# Patient Record
Sex: Female | Born: 1970 | Race: White | Hispanic: No | Marital: Married | Smoking: Never smoker
Health system: Southern US, Community
[De-identification: ages and names within clinical notes are randomized; demographics above are authoritative.]

## PROBLEM LIST (undated history)

## (undated) DIAGNOSIS — I87323 Chronic venous hypertension (idiopathic) with inflammation of bilateral lower extremity: Secondary | ICD-10-CM

## (undated) DIAGNOSIS — D649 Anemia, unspecified: Secondary | ICD-10-CM

## (undated) DIAGNOSIS — B019 Varicella without complication: Secondary | ICD-10-CM

## (undated) DIAGNOSIS — I872 Venous insufficiency (chronic) (peripheral): Secondary | ICD-10-CM

## (undated) DIAGNOSIS — H16139 Photokeratitis, unspecified eye: Secondary | ICD-10-CM

## (undated) DIAGNOSIS — N920 Excessive and frequent menstruation with regular cycle: Secondary | ICD-10-CM

## (undated) DIAGNOSIS — F53 Postpartum depression: Secondary | ICD-10-CM

## (undated) DIAGNOSIS — T7840XA Allergy, unspecified, initial encounter: Secondary | ICD-10-CM

## (undated) DIAGNOSIS — O99345 Other mental disorders complicating the puerperium: Secondary | ICD-10-CM

## (undated) DIAGNOSIS — F40243 Fear of flying: Secondary | ICD-10-CM

## (undated) DIAGNOSIS — H469 Unspecified optic neuritis: Secondary | ICD-10-CM

## (undated) DIAGNOSIS — G35 Multiple sclerosis: Secondary | ICD-10-CM

## (undated) HISTORY — DX: Anemia, unspecified: D64.9

## (undated) HISTORY — DX: Venous insufficiency (chronic) (peripheral): I87.2

## (undated) HISTORY — DX: Postpartum depression: F53.0

## (undated) HISTORY — DX: Multiple sclerosis: G35

## (undated) HISTORY — DX: Other mental disorders complicating the puerperium: O99.345

## (undated) HISTORY — DX: Excessive and frequent menstruation with regular cycle: N92.0

## (undated) HISTORY — DX: Chronic venous hypertension (idiopathic) with inflammation of bilateral lower extremity: I87.323

## (undated) HISTORY — DX: Unspecified optic neuritis: H46.9

## (undated) HISTORY — DX: Fear of flying: F40.243

## (undated) HISTORY — DX: Varicella without complication: B01.9

## (undated) HISTORY — DX: Photokeratitis, unspecified eye: H16.139

## (undated) HISTORY — DX: Allergy, unspecified, initial encounter: T78.40XA

---

## 2001-01-24 HISTORY — PX: DILATION AND EVACUATION: SHX1459

## 2003-01-25 DIAGNOSIS — G35 Multiple sclerosis: Secondary | ICD-10-CM

## 2003-01-25 HISTORY — DX: Multiple sclerosis: G35

## 2005-01-24 DIAGNOSIS — G35 Multiple sclerosis: Secondary | ICD-10-CM

## 2005-01-24 HISTORY — DX: Multiple sclerosis: G35

## 2012-01-25 HISTORY — PX: LASER ABLATION: SHX1947

## 2016-05-16 HISTORY — PX: OTHER SURGICAL HISTORY: SHX169

## 2016-07-14 LAB — BASIC METABOLIC PANEL
BUN: 15 (ref 4–21)
CREATININE: 0.8 (ref 0.5–1.1)
Glucose: 79
Potassium: 4.4 (ref 3.4–5.3)
Sodium: 138 (ref 137–147)

## 2016-07-14 LAB — LIPID PANEL
CHOLESTEROL: 165 (ref 0–200)
HDL: 72 — AB (ref 35–70)
LDL Cholesterol: 55
Triglycerides: 188 — AB (ref 40–160)

## 2016-07-14 LAB — HEPATIC FUNCTION PANEL
ALT: 23 (ref 7–35)
AST: 17 (ref 13–35)
Alkaline Phosphatase: 59 (ref 25–125)
Bilirubin, Total: 0.3

## 2016-07-14 LAB — CBC AND DIFFERENTIAL
HCT: 39 (ref 36–46)
Hemoglobin: 13.8 (ref 12.0–16.0)
NEUTROS ABS: 3
Platelets: 254 (ref 150–399)
WBC: 6.5

## 2016-07-14 LAB — VITAMIN D 25 HYDROXY (VIT D DEFICIENCY, FRACTURES): VIT D 25 HYDROXY: 58.7

## 2016-07-14 LAB — TSH: TSH: 1.05 (ref 0.41–5.90)

## 2016-12-07 DIAGNOSIS — H16139 Photokeratitis, unspecified eye: Secondary | ICD-10-CM

## 2016-12-07 HISTORY — PX: OTHER SURGICAL HISTORY: SHX169

## 2016-12-07 HISTORY — DX: Photokeratitis, unspecified eye: H16.139

## 2016-12-19 LAB — HM MAMMOGRAPHY

## 2017-07-10 HISTORY — PX: OTHER SURGICAL HISTORY: SHX169

## 2017-11-21 ENCOUNTER — Ambulatory Visit (INDEPENDENT_AMBULATORY_CARE_PROVIDER_SITE_OTHER): Payer: Managed Care, Other (non HMO) | Admitting: Family Medicine

## 2017-11-21 ENCOUNTER — Encounter: Payer: Self-pay | Admitting: Family Medicine

## 2017-11-21 VITALS — BP 106/74 | HR 80 | Temp 98.3°F | Resp 20 | Ht 64.0 in | Wt 157.0 lb

## 2017-11-21 DIAGNOSIS — K580 Irritable bowel syndrome with diarrhea: Secondary | ICD-10-CM

## 2017-11-21 DIAGNOSIS — Z7689 Persons encountering health services in other specified circumstances: Secondary | ICD-10-CM

## 2017-11-21 DIAGNOSIS — Z3041 Encounter for surveillance of contraceptive pills: Secondary | ICD-10-CM

## 2017-11-21 DIAGNOSIS — G35D Multiple sclerosis, unspecified: Secondary | ICD-10-CM

## 2017-11-21 DIAGNOSIS — L570 Actinic keratosis: Secondary | ICD-10-CM | POA: Diagnosis not present

## 2017-11-21 DIAGNOSIS — G35 Multiple sclerosis: Secondary | ICD-10-CM | POA: Diagnosis not present

## 2017-11-21 MED ORDER — NORGESTIMATE-ETH ESTRADIOL 0.25-35 MG-MCG PO TABS: 1.0000 | ORAL_TABLET | Freq: Every day | ORAL | 2 refills | Status: DC

## 2017-11-21 MED ORDER — HYOSCYAMINE SULFATE 0.125 MG SL SUBL: 0.1250 mg | SUBLINGUAL_TABLET | SUBLINGUAL | 1 refills | Status: DC | PRN

## 2017-11-21 NOTE — Progress Notes (Signed)
Patient ID: Taylor Clements, female  DOB: 17-Jan-1971, 47 y.o.   MRN: [id number] Patient Care Team    Relationship Specialty Notifications Start End  Natalia Leatherwood, DO PCP - General Family Medicine  11/21/17     Chief Complaint  Patient presents with  . Establish Care    Subjective:  Taylor Clements is a 47 y.o.  female present for new patient establishment. All past medical history, surgical history, allergies, family history, immunizations, medications and social history were updated in the electronic medical record today. All recent labs, ED visits and hospitalizations within the last year were reviewed.  Moved from [place].  Multiple sclerosis (HCC) Diagnosed in 2005 after optic neuritis. She reports when she gets flares she has IBS-D like symptoms, fatigue, right arm paresthesia. She has been managed on peg-rinterferon (Plegridy). She Would like referral to Dr. Leilani Merl, neuro.   Actinic keratosis Has been followed by dermatology for AK monitoring. No h/o skin cancer. Would like referral to derm.   Irritable bowel syndrome with diarrhea Mostly with MS flares, but does have some episodes when she does not eat right as well. Takes levsin when needed.   Birth control pill maintenance: Last PAP 2016. Hs been on sprintec for menorrhagia. Due for refills.   Depression screen PHQ 2/9 11/21/2017  Decreased Interest 0  Down, Depressed, Hopeless 0  PHQ - 2 Score 0   No flowsheet data found.  Current Exercise Habits: The patient does not participate in regular exercise at present Exercise limited by: None identified No flowsheet data found.   There is no immunization history on file for this patient.  No exam data present  Past Medical History:  Diagnosis Date  . Actinic keratitis 12/07/2016   shaved, deep margins- pt declined further biospy, aldara and  Excel V laser completed by derm  . Allergy   . Chicken pox   . Idiopathic chronic venous hypertension of both legs  with inflammation   . Menorrhagia    control on BCP  . Multiple sclerosis (HCC) 2005   diagnosed 2005; after optic Neuritis. Has IBS, fatiue and right arm paresthesia w/ flares  . Optic neuritis due to multiple sclerosis (HCC) 2007   loss vision; IV steroids --> vision returned.   . Phobia, flying    uses ativan with flights  . Post partum depression   . Venous insufficiency (chronic) (peripheral)    No Known Allergies Past Surgical History:  Procedure Laterality Date  . DILATION AND EVACUATION  2003   nonviable pregnancy  . image: duplex LLE Left 05/16/2016   scanned into media.   . Image: MRI Brain  07/10/2017   Stable minor subcortical & periventricular white matter changes; MS  . LASER ABLATION Left 2014   left greater saphenous vein, then endoevous chemical ablation and sclerotherpay.   . procedure: Excel V laser  12/07/2016   Ak tip of nose shaved-deep margins-pt declined deeper bx, aldara cream and laser completed by derm.    Family History  Problem Relation Age of Onset  . Breast cancer Mother   . Asthma Brother   . Depression Brother   . Drug abuse Brother   . Learning disabilities Brother   . Stroke Maternal Grandmother    Social History   Socioeconomic History  . Marital status: Married    Spouse name: Not on file  . Number of children: Not on file  . Years of education: Not on file  . Highest education level: Not  on file  Occupational History  . Not on file  Social Needs  . Financial resource strain: Not on file  . Food insecurity:    Worry: Not on file    Inability: Not on file  . Transportation needs:    Medical: Not on file    Non-medical: Not on file  Tobacco Use  . Smoking status: Former Games developer  . Smokeless tobacco: Never Used  Substance and Sexual Activity  . Alcohol use: Not Currently  . Drug use: Never  . Sexual activity: Yes    Partners: Male  Lifestyle  . Physical activity:    Days per week: Not on file    Minutes per session: Not  on file  . Stress: Not on file  Relationships  . Social connections:    Talks on phone: Not on file    Gets together: Not on file    Attends religious service: Not on file    Active member of club or organization: Not on file    Attends meetings of clubs or organizations: Not on file    Relationship status: Not on file  . Intimate partner violence:    Fear of current or ex partner: Not on file    Emotionally abused: Not on file    Physically abused: Not on file    Forced sexual activity: Not on file  Other Topics Concern  . Not on file  Social History Narrative   Marital status/children/pets: married, 1 child   Safety:      -smoke alarm in the home:Yes     - wears seatbelt: Yes     - Feels safe in their relationships: Yes   Allergies as of 11/21/2017   No Known Allergies     Medication List        Accurate as of 11/21/17 11:59 PM. Always use your most recent med list.          Calcium 500-125 MG-UNIT Tabs Take 2 tablets by mouth daily.   Fish Oil 1200 MG Caps Take 1 capsule by mouth 2 (two) times daily.   hyoscyamine 0.125 MG SL tablet Commonly known as:  LEVSIN SL Place 1 tablet (0.125 mg total) under the tongue every 4 (four) hours as needed.   Loratadine 10 MG Caps Take 1 capsule by mouth daily.   LORazepam 0.5 MG tablet Commonly known as:  ATIVAN Take 0.5 mg by mouth every 8 (eight) hours. 1-2 tabs prior to flight for anxiety   multivitamin capsule Take 1 capsule by mouth daily.   norgestimate-ethinyl estradiol 0.25-35 MG-MCG tablet Commonly known as:  ORTHO-CYCLEN,SPRINTEC,PREVIFEM Take 1 tablet by mouth daily.   Olopatadine HCl 0.2 % Soln Apply 1 drop to eye daily.   PLEGRIDY 125 MCG/0.5ML Sopn Generic drug:  Peginterferon Beta-1a Inject 125 mcg into the skin every 14 (fourteen) days.       All past medical history, surgical history, allergies, family history, immunizations andmedications were updated in the EMR today and reviewed under the  history and medication portions of their EMR.    No results found for this or any previous visit (from the past 2160 hour(s)).  Patient was never admitted.   ROS: 14 pt review of systems performed and negative (unless mentioned in an HPI)  Objective: BP 106/74 (BP Location: Right Arm, Patient Position: Sitting, Cuff Size: Large)   Pulse 80   Temp 98.3 F (36.8 C)   Resp 20   Ht 5\' 4"  (1.626 m)   Wt  157 lb (71.2 kg)   LMP 11/08/2017   SpO2 100%   BMI 26.95 kg/m  Gen: Afebrile. No acute distress. Nontoxic in appearance, well-developed, well-nourished, pleasant Caucasian female. HENT: AT. [place]. MMM, no oral lesions, adequate dentition.  Eyes:Pupils Equal Round Reactive to light, Extraocular movements intact,  Conjunctiva without redness, discharge or icterus. Neck/lymp/endocrine: Supple, no lymphadenopathy, no thyromegaly CV: RRR no murmur Chest: CTAB, no wheeze, rhonchi or crackles.  Abd: Soft. NTND. BS present. Skin: Warm and well-perfused. Skin intact. Neuro/Msk:  Normal gait. PERLA. EOMi. Alert. Oriented x3.   Psych: Normal affect, dress and demeanor. Normal speech. Normal thought content and judgment.   Assessment/plan: Anjelica Berres is a 47 y.o. female present for est care.  Multiple sclerosis (HCC) Was able to review some of patient's records she brought with her today after her establishment visit.  She had requested referral to Dr. Cena Benton and neurology and this was placed today.  We will upload her most current neurology records into the system and fax to neurology with the referral today.  This of course was done with her permission. - Ambulatory referral to Neurology  Actinic keratosis She had been monitored for actinic keratosis by dermatology.  One particular location at the tip of her nose has undergone shave biopsy, Aldara and laser treatment. - Ambulatory referral to Dermatology  Irritable bowel syndrome with diarrhea Is commonly triggered by an MS flare and/or  poor eating habits.  It is stable on Levsin 0.25 mg every 4 hours as needed.  Refills provided to her today.  Encounter for birth control pills maintenance Originally placed on birth control pills for menorrhagia.  She has been doing well on Sprintec.  Last Pap was reported in 2016.  Agreed to refill patient's birth control pills for 3 months.  In the meantime she will schedule a CPE with Pap and further refills will be completed after her Pap.  She is agreeable to this today.  Return in about 3 months (around 02/21/2018) for CPE with Pap.   Note is dictated utilizing voice recognition software. Although note has been proof read prior to signing, occasional typographical errors still can be missed. If any questions arise, please do not hesitate to call for verification.  Electronically signed by: Felix Pacini, DO [facility] [place]

## 2017-11-21 NOTE — Patient Instructions (Addendum)
Very nice to meet you today.  Please schedule your Physical and PAP with in next 2 months. I have refilled the birth control pills and levsin.   Referred you to neurology and dermatology, they will call you with results.   Please help [place] help you:  We are honored you have chosen [facility] for your Primary Care home. Below you will find basic instructions that you may need to access in the future. Please help [place] help you by reading the instructions, which cover many of the frequent questions we experience.   Prescription refills and request:  -In order to allow more efficient response time, please call your pharmacy for all refills. They will forward the request electronically to [place]. This allows for the quickest possible response. Request left on a nurse line can take longer to refill, since these are checked as time allows between office patients and other phone calls.  - refill request can take up to 3-5 working days to complete.  - If request is sent electronically and request is appropiate, it is usually completed in 1-2 business days.  - all patients will need to be seen routinely for all chronic medical conditions requiring prescription medications (see follow-up below). If you are overdue for follow up on your condition, you will be asked to make an appointment and we will call in enough medication to cover you until your appointment (up to 30 days).  - all controlled substances will require a face to face visit to request/refill.  - if you desire your prescriptions to go through a new pharmacy, and have an active script at original pharmacy, you will need to call your pharmacy and have scripts transferred to new pharmacy. This is completed between the pharmacy locations and not by your provider.    Results: If any images or labs were ordered, it can take up to 1 week to get results depending on the test ordered and the lab/facility running and resulting the test. - Normal or stable  results, which do not need further discussion, may be released to your mychart immediately with attached note to you. A call may not be generated for normal results. Please make certain to sign up for mychart. If you have questions on how to activate your mychart you can call the front office.  - If your results need further discussion, our office will attempt to contact you via phone, and if unable to reach you after 2 attempts, we will release your abnormal result to your mychart with instructions.  - All results will be automatically released in mychart after 1 week.  - Your provider will provide you with explanation and instruction on all relevant material in your results. Please keep in mind, results and labs may appear confusing or abnormal to the untrained eye, but it does not mean they are actually abnormal for you personally. If you have any questions about your results that are not covered, or you desire more detailed explanation than what was provided, you should make an appointment with your provider to do so.   Our office handles many outgoing and incoming calls daily. If we have not contacted you within 1 week about your results, please check your mychart to see if there is a message first and if not, then contact our office.  In helping with this matter, you help decrease call volume, and therefore allow [place] to be able to respond to patients needs more efficiently.   Acute office visits (sick  visit):  An acute visit is intended for a new problem and are scheduled in shorter time slots to allow schedule openings for patients with new problems. This is the appropriate visit to discuss a new problem. Problems will not be addressed by phone call or Echart message. Appointment is needed if requesting treatment. In order to provide you with excellent quality medical care with proper time for you to explain your problem, have an exam and receive treatment with instructions, these appointments should  be limited to one new problem per visit. If you experience a new problem, in which you desire to be addressed, please make an acute office visit, we save openings on the schedule to accommodate you. Please do not save your new problem for any other type of visit, let us take care of it properly and quickly for you.   Follow up visits:  Depending on your condition(s) your provider will need to see you routinely in order to provide you with quality care and prescribe medication(s). Most chronic conditions (Example: hypertension, Diabetes, depression/anxiety... etc), require visits a couple times a year. Your provider will instruct you on proper follow up for your personal medical conditions and history. Please make certain to make follow up appointments for your condition as instructed. Failing to do so could result in lapse in your medication treatment/refills. If you request a refill, and are overdue to be seen on a condition, we will always provide you with a 30 day script (once) to allow you time to schedule.    Medicare wellness (well visit): - we have a wonderful Nurse Maudie Mercury), that will meet with you and provide you will yearly medicare wellness visits. These visits should occur yearly (can not be scheduled less than 1 calendar year apart) and cover preventive health, immunizations, advance directives and screenings you are entitled to yearly through your medicare benefits. Do not miss out on your entitled benefits, this is when medicare will pay for these benefits to be ordered for you.  These are strongly encouraged by your provider and is the appropriate type of visit to make certain you are up to date with all preventive health benefits. If you have not had your medicare wellness exam in the last 12 months, please make certain to schedule one by calling the office and schedule your medicare wellness with Maudie Mercury as soon as possible.   Yearly physical (well visit):  - Adults are recommended to be seen  yearly for physicals. Check with your insurance and date of your last physical, most insurances require one calendar year between physicals. Physicals include all preventive health topics, screenings, medical exam and labs that are appropriate for gender/age and history. You may have fasting labs needed at this visit. This is a well visit (not a sick visit), new problems should not be covered during this visit (see acute visit).  - Pediatric patients are seen more frequently when they are younger. Your provider will advise you on well child visit timing that is appropriate for your their age. - This is not a medicare wellness visit. Medicare wellness exams do not have an exam portion to the visit. Some medicare companies allow for a physical, some do not allow a yearly physical. If your medicare allows a yearly physical you can schedule the medicare wellness with our nurse Maudie Mercury and have your physical with your provider after, on the same day. Please check with insurance for your full benefits.   Late Policy/No Shows:  - all new patients  should arrive 15-30 minutes earlier than appointment to allow [place] time  to  obtain all personal demographics,  insurance information and for you to complete office paperwork. - All established patients should arrive 10-15 minutes earlier than appointment time to update all information and be checked in .  - In our best efforts to run on time, if you are late for your appointment you will be asked to either reschedule or if able, we will work you back into the schedule. There will be a wait time to work you back in the schedule,  depending on availability.  - If you are unable to make it to your appointment as scheduled, please call 24 hours ahead of time to allow [place] to fill the time slot with someone else who needs to be seen. If you do not cancel your appointment ahead of time, you may be charged a no show fee.

## 2017-11-22 ENCOUNTER — Encounter: Payer: Self-pay | Admitting: Family Medicine

## 2017-11-22 DIAGNOSIS — Z3041 Encounter for surveillance of contraceptive pills: Secondary | ICD-10-CM | POA: Insufficient documentation

## 2017-11-22 DIAGNOSIS — K580 Irritable bowel syndrome with diarrhea: Secondary | ICD-10-CM | POA: Insufficient documentation

## 2017-11-22 DIAGNOSIS — L570 Actinic keratosis: Secondary | ICD-10-CM | POA: Insufficient documentation

## 2017-11-23 ENCOUNTER — Encounter: Payer: Self-pay | Admitting: Family Medicine

## 2017-12-27 ENCOUNTER — Other Ambulatory Visit (HOSPITAL_COMMUNITY)
Admission: RE | Admit: 2017-12-27 | Discharge: 2017-12-27 | Disposition: A | Discharge status: Home / Self Care | Payer: Managed Care, Other (non HMO) | Source: Ambulatory Visit | Attending: Family Medicine | Admitting: Family Medicine

## 2017-12-27 ENCOUNTER — Encounter: Payer: Self-pay | Admitting: Family Medicine

## 2017-12-27 ENCOUNTER — Ambulatory Visit (INDEPENDENT_AMBULATORY_CARE_PROVIDER_SITE_OTHER): Payer: Managed Care, Other (non HMO) | Admitting: Family Medicine

## 2017-12-27 VITALS — BP 115/77 | HR 88 | Temp 98.0°F | Resp 16 | Ht 64.5 in | Wt 159.0 lb

## 2017-12-27 DIAGNOSIS — Z01419 Encounter for gynecological examination (general) (routine) without abnormal findings: Secondary | ICD-10-CM

## 2017-12-27 DIAGNOSIS — Z1239 Encounter for other screening for malignant neoplasm of breast: Secondary | ICD-10-CM

## 2017-12-27 DIAGNOSIS — Z0000 Encounter for general adult medical examination without abnormal findings: Secondary | ICD-10-CM | POA: Diagnosis not present

## 2017-12-27 DIAGNOSIS — Z114 Encounter for screening for human immunodeficiency virus [HIV]: Secondary | ICD-10-CM

## 2017-12-27 DIAGNOSIS — E781 Pure hyperglyceridemia: Secondary | ICD-10-CM | POA: Diagnosis not present

## 2017-12-27 DIAGNOSIS — Z130 Encounter for screening for diseases of the blood and blood-forming organs and certain disorders involving the immune mechanism: Secondary | ICD-10-CM

## 2017-12-27 DIAGNOSIS — Z23 Encounter for immunization: Secondary | ICD-10-CM | POA: Diagnosis not present

## 2017-12-27 DIAGNOSIS — Z131 Encounter for screening for diabetes mellitus: Secondary | ICD-10-CM | POA: Diagnosis not present

## 2017-12-27 DIAGNOSIS — K580 Irritable bowel syndrome with diarrhea: Secondary | ICD-10-CM

## 2017-12-27 DIAGNOSIS — G35 Multiple sclerosis: Secondary | ICD-10-CM

## 2017-12-27 DIAGNOSIS — Z3041 Encounter for surveillance of contraceptive pills: Secondary | ICD-10-CM

## 2017-12-27 LAB — LIPID PANEL
Cholesterol: 182 mg/dL (ref 0–200)
HDL: 74.3 mg/dL (ref >39.00)
LDL Cholesterol: 76 mg/dL (ref 0–99)
NonHDL: 107.49
Total CHOL/HDL Ratio: 2
Triglycerides: 157 mg/dL — ABNORMAL HIGH (ref 0.0–149.0)
VLDL: 31.4 mg/dL (ref 0.0–40.0)

## 2017-12-27 LAB — HEMOGLOBIN A1C: Hgb A1c MFr Bld: 5.6 % (ref 4.6–6.5)

## 2017-12-27 LAB — COMPREHENSIVE METABOLIC PANEL
ALBUMIN: 4.3 g/dL (ref 3.5–5.2)
ALT: 14 U/L (ref 0–35)
AST: 13 U/L (ref 0–37)
Alkaline Phosphatase: 53 U/L (ref 39–117)
BUN: 16 mg/dL (ref 6–23)
CO2: 24 mEq/L (ref 19–32)
Calcium: 9.4 mg/dL (ref 8.4–10.5)
Chloride: 105 mEq/L (ref 96–112)
Creatinine, Ser: 0.68 mg/dL (ref 0.40–1.20)
GFR: 98.35 mL/min (ref >60.00)
Glucose, Bld: 85 mg/dL (ref 70–99)
Potassium: 4.3 mEq/L (ref 3.5–5.1)
Sodium: 138 mEq/L (ref 135–145)
Total Bilirubin: 0.4 mg/dL (ref 0.2–1.2)
Total Protein: 7.4 g/dL (ref 6.0–8.3)

## 2017-12-27 LAB — CBC
HCT: 39.9 % (ref 36.0–46.0)
Hemoglobin: 13.6 g/dL (ref 12.0–15.0)
MCHC: 34 g/dL (ref 30.0–36.0)
MCV: 89.6 fl (ref 78.0–100.0)
Platelets: 315 10*3/uL (ref 150.0–400.0)
RBC: 4.46 Mil/uL (ref 3.87–5.11)
RDW: 12.9 % (ref 11.5–15.5)
WBC: 5.6 10*3/uL (ref 4.0–10.5)

## 2017-12-27 LAB — TSH: TSH: 1.02 u[IU]/mL (ref 0.35–4.50)

## 2017-12-27 LAB — HM PAP SMEAR: HM Pap smear: NEGATIVE

## 2017-12-27 MED ORDER — NORGESTIMATE-ETH ESTRADIOL 0.25-35 MG-MCG PO TABS: 1.0000 | ORAL_TABLET | Freq: Every day | ORAL | 11 refills | Status: DC

## 2017-12-27 MED ORDER — HYOSCYAMINE SULFATE 0.125 MG SL SUBL: 0.1250 mg | SUBLINGUAL_TABLET | SUBLINGUAL | 1 refills | Status: DC | PRN

## 2017-12-27 NOTE — Patient Instructions (Addendum)
We will call you with all your lab results once resulted.  They will call to schedule your mammogram at the breast center.  Health Maintenance, Female Adopting a healthy lifestyle and getting preventive care can go a long way to promote health and wellness. Talk with your health care provider about what schedule of regular examinations is right for you. This is a good chance for you to check in with your provider about disease prevention and staying healthy. In between checkups, there are plenty of things you can do on your own. Experts have done a lot of research about which lifestyle changes and preventive measures are most likely to keep you healthy. Ask your health care provider for more information. Weight and diet Eat a healthy diet  Be sure to include plenty of vegetables, fruits, low-fat dairy products, and lean protein.  Do not eat a lot of foods high in solid fats, added sugars, or salt.  Get regular exercise. This is one of the most important things you can do for your health. ? Most adults should exercise for at least 150 minutes each week. The exercise should increase your heart rate and make you sweat (moderate-intensity exercise). ? Most adults should also do strengthening exercises at least twice a week. This is in addition to the moderate-intensity exercise.  Maintain a healthy weight  Body mass index (BMI) is a measurement that can be used to identify possible weight problems. It estimates body fat based on height and weight. Your health care provider can help determine your BMI and help you achieve or maintain a healthy weight.  For females 52 years of age and older: ? A BMI below 18.5 is considered underweight. ? A BMI of 18.5 to 24.9 is normal. ? A BMI of 25 to 29.9 is considered overweight. ? A BMI of 30 and above is considered obese.  Watch levels of cholesterol and blood lipids  You should start having your blood tested for lipids and cholesterol at 47 years of  age, then have this test every 5 years.  You may need to have your cholesterol levels checked more often if: ? Your lipid or cholesterol levels are high. ? You are older than 47 years of age. ? You are at high risk for heart disease.  Cancer screening Lung Cancer  Lung cancer screening is recommended for adults 64-29 years old who are at high risk for lung cancer because of a history of smoking.  A yearly low-dose CT scan of the lungs is recommended for people who: ? Currently smoke. ? Have quit within the past 15 years. ? Have at least a 30-pack-year history of smoking. A pack year is smoking an average of one pack of cigarettes a day for 1 year.  Yearly screening should continue until it has been 15 years since you quit.  Yearly screening should stop if you develop a health problem that would prevent you from having lung cancer treatment.  Breast Cancer  Practice breast self-awareness. This means understanding how your breasts normally appear and feel.  It also means doing regular breast self-exams. Let your health care provider know about any changes, no matter how small.  If you are in your 20s or 30s, you should have a clinical breast exam (CBE) by a health care provider every 1-3 years as part of a regular health exam.  If you are 55 or older, have a CBE every year. Also consider having a breast X-ray (mammogram) every year.  If  you have a family history of breast cancer, talk to your health care provider about genetic screening.  If you are at high risk for breast cancer, talk to your health care provider about having an MRI and a mammogram every year.  Breast cancer gene (BRCA) assessment is recommended for women who have family members with BRCA-related cancers. BRCA-related cancers include: ? Breast. ? Ovarian. ? Tubal. ? Peritoneal cancers.  Results of the assessment will determine the need for genetic counseling and BRCA1 and BRCA2 testing.  Cervical  Cancer Your health care provider may recommend that you be screened regularly for cancer of the pelvic organs (ovaries, uterus, and vagina). This screening involves a pelvic examination, including checking for microscopic changes to the surface of your cervix (Pap test). You may be encouraged to have this screening done every 3 years, beginning at age 68.  For women ages 86-65, health care providers may recommend pelvic exams and Pap testing every 3 years, or they may recommend the Pap and pelvic exam, combined with testing for human papilloma virus (HPV), every 5 years. Some types of HPV increase your risk of cervical cancer. Testing for HPV may also be done on women of any age with unclear Pap test results.  Other health care providers may not recommend any screening for nonpregnant women who are considered low risk for pelvic cancer and who do not have symptoms. Ask your health care provider if a screening pelvic exam is right for you.  If you have had past treatment for cervical cancer or a condition that could lead to cancer, you need Pap tests and screening for cancer for at least 20 years after your treatment. If Pap tests have been discontinued, your risk factors (such as having a new sexual partner) need to be reassessed to determine if screening should resume. Some women have medical problems that increase the chance of getting cervical cancer. In these cases, your health care provider may recommend more frequent screening and Pap tests.  Colorectal Cancer  This type of cancer can be detected and often prevented.  Routine colorectal cancer screening usually begins at 47 years of age and continues through 47 years of age.  Your health care provider may recommend screening at an earlier age if you have risk factors for colon cancer.  Your health care provider may also recommend using home test kits to check for hidden blood in the stool.  A small camera at the end of a tube can be used to  examine your colon directly (sigmoidoscopy or colonoscopy). This is done to check for the earliest forms of colorectal cancer.  Routine screening usually begins at age 17.  Direct examination of the colon should be repeated every 5-10 years through 47 years of age. However, you may need to be screened more often if early forms of precancerous polyps or small growths are found.  Skin Cancer  Check your skin from head to toe regularly.  Tell your health care provider about any new moles or changes in moles, especially if there is a change in a mole's shape or color.  Also tell your health care provider if you have a mole that is larger than the size of a pencil eraser.  Always use sunscreen. Apply sunscreen liberally and repeatedly throughout the day.  Protect yourself by wearing long sleeves, pants, a wide-brimmed hat, and sunglasses whenever you are outside.  Heart disease, diabetes, and high blood pressure  High blood pressure causes heart disease and increases  the risk of stroke. High blood pressure is more likely to develop in: ? People who have blood pressure in the high end of the normal range (130-139/85-89 mm Hg). ? People who are overweight or obese. ? People who are African American.  If you are 41-17 years of age, have your blood pressure checked every 3-5 years. If you are 40 years of age or older, have your blood pressure checked every year. You should have your blood pressure measured twice-once when you are at a hospital or clinic, and once when you are not at a hospital or clinic. Record the average of the two measurements. To check your blood pressure when you are not at a hospital or clinic, you can use: ? An automated blood pressure machine at a pharmacy. ? A home blood pressure monitor.  If you are between 10 years and 68 years old, ask your health care provider if you should take aspirin to prevent strokes.  Have regular diabetes screenings. This involves taking a  blood sample to check your fasting blood sugar level. ? If you are at a normal weight and have a low risk for diabetes, have this test once every three years after 47 years of age. ? If you are overweight and have a high risk for diabetes, consider being tested at a younger age or more often. Preventing infection Hepatitis B  If you have a higher risk for hepatitis B, you should be screened for this virus. You are considered at high risk for hepatitis B if: ? You were born in a country where hepatitis B is common. Ask your health care provider which countries are considered high risk. ? Your parents were born in a high-risk country, and you have not been immunized against hepatitis B (hepatitis B vaccine). ? You have HIV or AIDS. ? You use needles to inject street drugs. ? You live with someone who has hepatitis B. ? You have had sex with someone who has hepatitis B. ? You get hemodialysis treatment. ? You take certain medicines for conditions, including cancer, organ transplantation, and autoimmune conditions.  Hepatitis C  Blood testing is recommended for: ? Everyone born from [birth year] through 1965. ? Anyone with known risk factors for hepatitis C.  Sexually transmitted infections (STIs)  You should be screened for sexually transmitted infections (STIs) including gonorrhea and chlamydia if: ? You are sexually active and are younger than 47 years of age. ? You are older than 47 years of age and your health care provider tells you that you are at risk for this type of infection. ? Your sexual activity has changed since you were last screened and you are at an increased risk for chlamydia or gonorrhea. Ask your health care provider if you are at risk.  If you do not have HIV, but are at risk, it may be recommended that you take a prescription medicine daily to prevent HIV infection. This is called pre-exposure prophylaxis (PrEP). You are considered at risk if: ? You are sexually active and  do not regularly use condoms or know the HIV status of your partner(s). ? You take drugs by injection. ? You are sexually active with a partner who has HIV.  Talk with your health care provider about whether you are at high risk of being infected with HIV. If you choose to begin PrEP, you should first be tested for HIV. You should then be tested every 3 months for as long as you are taking PrEP.  Pregnancy  If you are premenopausal and you may become pregnant, ask your health care provider about preconception counseling.  If you may become pregnant, take 400 to 800 micrograms (mcg) of folic acid every day.  If you want to prevent pregnancy, talk to your health care provider about birth control (contraception). Osteoporosis and menopause  Osteoporosis is a disease in which the bones lose minerals and strength with aging. This can result in serious bone fractures. Your risk for osteoporosis can be identified using a bone density scan.  If you are 37 years of age or older, or if you are at risk for osteoporosis and fractures, ask your health care provider if you should be screened.  Ask your health care provider whether you should take a calcium or vitamin D supplement to lower your risk for osteoporosis.  Menopause may have certain physical symptoms and risks.  Hormone replacement therapy may reduce some of these symptoms and risks. Talk to your health care provider about whether hormone replacement therapy is right for you. Follow these instructions at home:  Schedule regular health, dental, and eye exams.  Stay current with your immunizations.  Do not use any tobacco products including cigarettes, chewing tobacco, or electronic cigarettes.  If you are pregnant, do not drink alcohol.  If you are breastfeeding, limit how much and how often you drink alcohol.  Limit alcohol intake to no more than 1 drink per day for nonpregnant women. One drink equals 12 ounces of beer, 5 ounces of  wine, or 1 ounces of hard liquor.  Do not use street drugs.  Do not share needles.  Ask your health care provider for help if you need support or information about quitting drugs.  Tell your health care provider if you often feel depressed.  Tell your health care provider if you have ever been abused or do not feel safe at home. This information is not intended to replace advice given to you by your health care provider. Make sure you discuss any questions you have with your health care provider. Document Released: 07/26/2010 Document Revised: 06/18/2015 Document Reviewed: 10/14/2014 Elsevier Interactive Patient Education  [employer].

## 2017-12-27 NOTE — Progress Notes (Signed)
Patient ID: Taylor Clements, female  DOB: 27-Jul-1970, 47 y.o.   MRN: [id number] Patient Care Team    Relationship Specialty Notifications Start End  Ma Hillock, DO PCP - General Family Medicine  11/21/17     Chief Complaint  Patient presents with  . Annual Exam    w Pap    Subjective:  Taylor Clements is a 47 y.o.  Female  present for CPE. All past medical history, surgical history, allergies, family history, immunizations, medications and social history were updated in the electronic medical record today. All recent labs, ED visits and hospitalizations within the last year were reviewed.  Well women exam: cycle is routine with sprintec use. Bleeding last 3-5 d, the last 2 days are spotting only. No vaginal irritation, lesions or dyspareunia.  Health maintenance:  Colonoscopy: No fhx; routine screen Mammogram: completed: [place] mother, completed 12/19/2016, birads 1.  Cervical cancer screening: last pap: 2016, results: pt reported normal. Collected today Immunizations: tdap 2015, Influenza completed today (encouraged yearly) Infectious disease screening: HIV  DEXA: routine screen, no chronic steroid with MS  Assistive device: none Oxygen QTM:AUQJ Patient has a Dental home. Hospitalizations/ED visits: reviewed  Depression screen [facility] 2/9 12/27/2017 11/21/2017  Decreased Interest 0 0  Down, Depressed, Hopeless 0 0  PHQ - 2 Score 0 0  Altered sleeping 0 -  Tired, decreased energy 1 -  Change in appetite 0 -  Feeling bad or failure about yourself  0 -  Trouble concentrating 0 -  Moving slowly or fidgety/restless 0 -  Suicidal thoughts 0 -  PHQ-9 Score 1 -  Difficult doing work/chores Not difficult at all -   GAD 7 : Generalized Anxiety Score 12/27/2017  Nervous, Anxious, on Edge 0  Control/stop worrying 0  Worry too much - different things 0  Trouble relaxing 0  Restless 0  Easily annoyed or irritable 1  Afraid - awful might happen 0  Total GAD 7 Score 1  Anxiety  Difficulty Not difficult at all   Current Exercise Habits: Home exercise routine, Type of exercise: walking, Time (Minutes): 40, Frequency (Times/Week): 2, Weekly Exercise (Minutes/Week): 80, Intensity: Moderate Exercise limited by: None identified  Immunization History  Administered Date(s) Administered  . Influenza,inj,Quad PF,6+ Mos 12/27/2017    Past Medical History:  Diagnosis Date  . Actinic keratitis 12/07/2016   shaved, deep margins- pt declined further biospy, aldara and  Excel V laser completed by derm  . Allergy   . Chicken pox   . Idiopathic chronic venous hypertension of both legs with inflammation   . Menorrhagia    control on BCP  . Multiple sclerosis ([place]) 2005   diagnosed 2005; after optic Neuritis. Has IBS, fatiue and right arm paresthesia w/ flares  . Optic neuritis due to multiple sclerosis ([place]) 2007   loss vision; IV steroids --> vision returned.   . Phobia, flying    uses ativan with flights  . Post partum depression   . Venous insufficiency (chronic) (peripheral)    No Known Allergies Past Surgical History:  Procedure Laterality Date  . DILATION AND EVACUATION  2003   nonviable pregnancy  . image: duplex LLE Left 05/16/2016   scanned into media.   . Image: MRI Brain  07/10/2017   Stable minor subcortical & periventricular white matter changes; MS  . LASER ABLATION Left 2014   left greater saphenous vein, then endoevous chemical ablation and sclerotherpay.   . procedure: Excel V laser  12/07/2016  Ak tip of nose shaved-deep margins-pt declined deeper bx, aldara cream and laser completed by derm.    Family History  Problem Relation Age of Onset  . Breast cancer Mother   . Asthma Brother   . Depression Brother   . Drug abuse Brother   . Learning disabilities Brother   . Stroke Maternal Grandmother    Social History   Socioeconomic History  . Marital status: Married    Spouse name: Not on file  . Number of children: Not on file  . Years of  education: Not on file  . Highest education level: Not on file  Occupational History  . Not on file  Social Needs  . Financial resource strain: Not on file  . Food insecurity:    Worry: Not on file    Inability: Not on file  . Transportation needs:    Medical: Not on file    Non-medical: Not on file  Tobacco Use  . Smoking status: Former Research scientist (life sciences)  . Smokeless tobacco: Never Used  Substance and Sexual Activity  . Alcohol use: Not Currently  . Drug use: Never  . Sexual activity: Yes    Partners: Male  Lifestyle  . Physical activity:    Days per week: Not on file    Minutes per session: Not on file  . Stress: Not on file  Relationships  . Social connections:    Talks on phone: Not on file    Gets together: Not on file    Attends religious service: Not on file    Active member of club or organization: Not on file    Attends meetings of clubs or organizations: Not on file    Relationship status: Not on file  . Intimate partner violence:    Fear of current or ex partner: Not on file    Emotionally abused: Not on file    Physically abused: Not on file    Forced sexual activity: Not on file  Other Topics Concern  . Not on file  Social History Narrative   Marital status/children/pets: married, 1 child   Safety:      -smoke alarm in the home:Yes     - wears seatbelt: Yes     - Feels safe in their relationships: Yes   Allergies as of 12/27/2017   No Known Allergies     Medication List        Accurate as of 12/27/17  1:36 PM. Always use your most recent med list.          Calcium 500-125 MG-UNIT Tabs Take 2 tablets by mouth daily.   Fish Oil 1200 MG Caps Take 1 capsule by mouth 2 (two) times daily.   hyoscyamine 0.125 MG SL tablet Commonly known as:  LEVSIN SL Place 1 tablet (0.125 mg total) under the tongue every 4 (four) hours as needed.   LORazepam 0.5 MG tablet Commonly known as:  ATIVAN Take 0.5 mg by mouth every 8 (eight) hours. 1-2 tabs prior to flight  for anxiety   multivitamin capsule Take 1 capsule by mouth daily.   norgestimate-ethinyl estradiol 0.25-35 MG-MCG tablet Commonly known as:  ORTHO-CYCLEN,SPRINTEC,PREVIFEM Take 1 tablet by mouth daily.   Olopatadine HCl 0.2 % Soln Apply 1 drop to eye daily.   PLEGRIDY 125 MCG/0.5ML Sopn Generic drug:  Peginterferon Beta-1a Inject 125 mcg into the skin every 14 (fourteen) days.      All past medical history, surgical history, allergies, family history, immunizations andmedications were updated  in the EMR today and reviewed under the history and medication portions of their EMR.     No results found for this or any previous visit (from the past 2160 hour(s)).  Patient was never admitted.   ROS: 14 pt review of systems performed and negative (unless mentioned in an HPI)  Objective: BP 115/77 (BP Location: Left Arm, Patient Position: Sitting, Cuff Size: Normal)   Pulse 88   Temp 98 F (36.7 C) (Oral)   Resp 16   Ht 5' 4.5" (1.638 m)   Wt 159 lb (72.1 kg)   LMP 12/06/2017   SpO2 100%   BMI 26.87 kg/m  Gen: Afebrile. No acute distress. Nontoxic in appearance, well-developed, well-nourished,  Overweight female.  HENT: AT. [place]. Bilateral TM visualized and normal in appearance, normal external auditory canal. MMM, no oral lesions, adequate dentition. Bilateral nares within normal limits. Throat without erythema, ulcerations or exudates. no Cough on exam, no hoarseness on exam. Eyes:Pupils Equal Round Reactive to light, Extraocular movements intact,  Conjunctiva without redness, discharge or icterus. Neck/lymp/endocrine: Supple,no lymphadenopathy, no thyromegaly CV: RRR no  murmur, no edema, +2/4 P posterior tibialis pulses. no carotid bruits. No JVD. Chest: CTAB, no wheeze, rhonchi or crackles. normal Respiratory effort. good Air movement. Abd: Soft. flat. NTND. BS present. no Masses palpated. No hepatosplenomegaly. No rebound tenderness or guarding. Skin: no rashes, purpura or  petechiae. Warm and well-perfused. Skin intact. Neuro/Msk:  Normal gait. PERLA. EOMi. Alert. Oriented x3.  Cranial nerves II through XII intact. Muscle strength 5/5 upper/lower extremity. DTRs equal bilaterally. Psych: Normal affect, dress and demeanor. Normal speech. Normal thought content and judgment. Breasts: breasts appear normal, symmetrical, no tenderness on exam, no suspicious masses, no skin or nipple changes or axillary nodes. GYN:  External genitalia within normal limits, normal hair distribution, no lesions. Urethral meatus normal, no lesions. Vaginal mucosa pink, moist, normal rugae, no lesions. No cystocele or rectocele. cervix without lesions, no discharge. Bimanual exam revealed normal uterus.  No bladder/suprapubic fullness, masses or tenderness. No cervical motion tenderness. No adnexal fullness. Anus and perineum within normal limits, no lesions.  No exam data present  Assessment/plan: Stepheny Canal is a 47 y.o. female present for CPE with PAP. Hypertriglyceridemia - Comp Met (CMET) - TSH - Lipid panel - diet and exercise  Encounter for routine gynecological examination with Papanicolaou smear of cervix - Cytology - PAP( [facility]) w/ HPV Screening for diabetes mellitus - HgB A1c Screening for deficiency anemia - CBC Immunization due - Flu Vaccine QUAD 6+ mos PF IM (Fluarix Quad PF) Encounter for screening for HIV - Pt agreeable to testing otday - HIV antibody (with reflex) Breast cancer screening - MM 3D SCREEN BREAST BILATERAL; Future Irritable bowel syndrome with diarrhea Refills on levsin Multiple sclerosis ([place]) Est with neuro Encounter for birth control pills maintenance Refills provided on BCP today for 1 year. Follow up yearly with CPE needed for future refills.  Encounter for preventive health examination Patient was encouraged to exercise greater than 150 minutes a week. Patient was encouraged to choose a diet filled with fresh fruits and vegetables,  and lean meats. AVS provided to patient today for education/recommendation on gender specific health and safety maintenance. Colonoscopy: No fhx; routine screen Mammogram: completed: [place] mother, completed 12/19/2016, birads 1.  Cervical cancer screening: last pap: 2016, results: pt reported normal. Collected today Immunizations: tdap 2015, Influenza completed today (encouraged yearly) Infectious disease screening: HIV  DEXA: routine screen, no chronic steroid with MS  Return in about 1 year (around 12/28/2018) for CPE.  Electronically signed by: Howard Pouch, DO [place]

## 2017-12-28 LAB — HIV ANTIBODY (ROUTINE TESTING W REFLEX): HIV 1&2 Ab, 4th Generation: NONREACTIVE

## 2017-12-29 ENCOUNTER — Encounter: Payer: Self-pay | Admitting: Family Medicine

## 2017-12-29 LAB — CYTOLOGY - PAP
Diagnosis: NEGATIVE
HPV: NOT DETECTED

## 2018-01-04 ENCOUNTER — Encounter: Payer: Self-pay | Admitting: Family Medicine

## 2018-01-10 ENCOUNTER — Ambulatory Visit
Admission: RE | Admit: 2018-01-10 | Discharge: 2018-01-10 | Disposition: A | Discharge status: Home / Self Care | Payer: Managed Care, Other (non HMO) | Source: Ambulatory Visit | Attending: Family Medicine | Admitting: Family Medicine

## 2018-01-10 DIAGNOSIS — Z1239 Encounter for other screening for malignant neoplasm of breast: Secondary | ICD-10-CM

## 2018-03-15 ENCOUNTER — Ambulatory Visit: Payer: Managed Care, Other (non HMO) | Admitting: Neurology

## 2018-03-15 ENCOUNTER — Telehealth: Payer: Self-pay | Admitting: *Deleted

## 2018-03-15 NOTE — Telephone Encounter (Signed)
Called pt. R/s appt today d/t inclement weather to 03/27/18 at 1:30pm. Pt verbalized understanding.

## 2018-03-27 ENCOUNTER — Encounter: Payer: Self-pay | Admitting: Neurology

## 2018-03-27 ENCOUNTER — Ambulatory Visit: Payer: Managed Care, Other (non HMO) | Admitting: Neurology

## 2018-03-27 VITALS — BP 119/70 | HR 91 | Ht 64.75 in | Wt 159.0 lb

## 2018-03-27 DIAGNOSIS — G35 Multiple sclerosis: Secondary | ICD-10-CM

## 2018-03-27 DIAGNOSIS — Z8669 Personal history of other diseases of the nervous system and sense organs: Secondary | ICD-10-CM

## 2018-03-27 DIAGNOSIS — Z79899 Other long term (current) drug therapy: Secondary | ICD-10-CM | POA: Insufficient documentation

## 2018-03-27 NOTE — Progress Notes (Signed)
[facility]  PATIENT: Taylor Clements DOB: 24-Mar-1970  REFERRING DOCTOR OR PCP:  Felix Pacini SOURCE: patient, notes from Dr. Claiborne Billings,   _________________________________   HISTORICAL  CHIEF COMPLAINT:  Chief Complaint  Patient presents with  . New Patient (Initial Visit)    RM 13, alone. Internal referral Kuneff, Renee A, DO at  [facility] for MS. She is transferring care from [place]. Moved to  this past summer. Husband got a job here.    . Multiple Sclerosis    Dx in 2005. Had optic neuritis/vision loss and received IV steroids when intially dx. Vision loss resolved after steroids. No vision problems since. Per PCP office note: "She reports when she gets flares she has IBS-D like symptoms, fatigue, right arm paresthesia". She is on Plegridy for DMT. Had MRI brain 06/2017. Has been on Avonex in the past--had inj site fatigue/muscle aches. She is stable on Plegridy. Has no family history of MS.  . MRI brain    Last done 07/10/2017. Impression: "stable minor subcortical and periventricular white matter changes consistent with the clinical dx of MS". Previous one done in 2016.     HISTORY OF PRESENT ILLNESS:  I had the pleasure seeing your patient, Taylor Clements, at the MS center at [facility] for neurologic consultation regarding her multiple sclerosis.      She is a 48 year old woman who was diagnosed with MS in late 2005.  She first presented with right optic neuritis that year.   She was treated with several days of IV Solu-Medrol and vision improved.   An MRI was consistent with MS.   She was initially placed on Avonex late 2005 or early 2006.  Around 2014, after Plegridy was approved, she switched from Avonex to Plegridy.   She has no definite exacerbation.  However, about 5 years ago she began to notes spasticity in her right arm and more fatigue.     She was placed on baclofen but only took it about a month or less.    The  spasticity improved.     She has not been on any treatment for her fatigue.   Currently, gait and balance are fine.   She could climb a ladder.    She denies any numbness or weakness in the limbs.    She denies any current bladder issues but has had some urinary urgency at times in the past and had a couple episodes of urge incontinence.  She has intermittent diarrhea and takes hyoscyamine prn.   This is worse with heat.    Vision returned to baseline initially.   More recently she notes some blurry vision with reading.        She has fatigue daily.    She yawns some but does not doze off much.   She has never been on Provigil or other medication for this.       She sleeps well most nights getting abou 8 hours many nights.     She does not snore.    She denies depression.     She feels cognition is usually good but mild reduced focus and attention.       She has some pain in the bottom of the left foot, most likely to other her when she walks barefoot after sleeping or resting a while.   It improves after moving around or putting on shoes.    I personally reviewed the MRI of the brain performed in 2018.  It shows 6 or 7 small T2/flair hyperintense foci, 2 in the periventricular white matter and the rest in the subcortical and deep white matter.  None of the foci appear to be acute.  There was no atrophy.  There are no lesions in the infratentorial white matter.  None of the foci enhance.  She has no FH of MS or other autoimmune disorder.        REVIEW OF SYSTEMS: Constitutional: No fevers, chills, sweats, or change in appetite.  She has fatigue.    Sleeps well Eyes: No visual changes, double vision, eye pain Ear, nose and throat: No hearing loss, ear pain, nasal congestion, sore throat Cardiovascular: No chest pain, palpitations Respiratory: No shortness of breath at rest or with exertion.   No wheezes GastrointestinaI: No nausea, vomiting, abdominal pain, fecal incontinence.  Occ diarrhea, maybe  IBS.  Genitourinary: No dysuria,.  No nocturia.  Some urgency Musculoskeletal: No neck pain, back pain Integumentary: No rash, pruritus, skin lesions Neurological: as above Psychiatric: No depression at this time.  No anxiety Endocrine: No palpitations, diaphoresis, change in appetite, change in weigh or increased thirst Hematologic/Lymphatic: No anemia, purpura, petechiae. Allergic/Immunologic: No itchy/runny eyes, nasal congestion, recent allergic reactions, rashes  ALLERGIES: No Known Allergies  HOME MEDICATIONS:  Current Outpatient Medications:  .  Calcium 500-125 MG-UNIT TABS, Take 2 tablets by mouth daily., Disp: , Rfl:  .  hyoscyamine (LEVSIN SL) 0.125 MG SL tablet, Place 1 tablet (0.125 mg total) under the tongue every 4 (four) hours as needed., Disp: 90 tablet, Rfl: 1 .  LORazepam (ATIVAN) 0.5 MG tablet, Take 0.5 mg by mouth every 8 (eight) hours. 1-2 tabs prior to flight for anxiety, Disp: , Rfl:  .  Multiple Vitamin (MULTIVITAMIN) capsule, Take 1 capsule by mouth daily., Disp: , Rfl:  .  norgestimate-ethinyl estradiol (SPRINTEC 28) 0.25-35 MG-MCG tablet, Take 1 tablet by mouth daily., Disp: 1 Package, Rfl: 11 .  Olopatadine HCl 0.2 % SOLN, Apply 1 drop to eye daily., Disp: , Rfl:  .  Omega-3 Fatty Acids (FISH OIL) 1200 MG CAPS, Take 1 capsule by mouth 2 (two) times daily., Disp: , Rfl:  .  Peginterferon Beta-1a (PLEGRIDY) 125 MCG/0.5ML SOPN, Inject 125 mcg into the skin every 14 (fourteen) days., Disp: , Rfl:   PAST MEDICAL HISTORY: Past Medical History:  Diagnosis Date  . Actinic keratitis 12/07/2016   shaved, deep margins- pt declined further biospy, aldara and  Excel V laser completed by derm  . Allergy   . Chicken pox   . Idiopathic chronic venous hypertension of both legs with inflammation   . Menorrhagia    control on BCP  . Multiple sclerosis (HCC) 2005   diagnosed 2005; after optic Neuritis. Has IBS, fatiue and right arm paresthesia w/ flares  . 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   . Phobia, flying    uses ativan with flights  . Post partum depression   . Venous insufficiency (chronic) (peripheral)     PAST SURGICAL HISTORY: Past Surgical History:  Procedure Laterality Date  . DILATION AND EVACUATION  2003   nonviable pregnancy  . image: duplex LLE Left 05/16/2016   scanned into media.   . Image: MRI Brain  07/10/2017   Stable minor subcortical & periventricular white matter changes; MS  . LASER ABLATION Left 2014   left greater saphenous vein, then endoevous chemical ablation and sclerotherpay.   . procedure:  Excel V laser  12/07/2016   Ak tip of nose shaved-deep margins-pt declined deeper bx, aldara cream and laser completed by derm.     FAMILY HISTORY: Family History  Problem Relation Age of Onset  . Breast cancer Mother   . Asthma Brother   . Depression Brother   . Drug abuse Brother   . Learning disabilities Brother   . Stroke Maternal Grandmother     SOCIAL HISTORY:  Social History   Socioeconomic History  . Marital status: Married    Spouse name: Aurther Loft  . Number of children: 1  . Years of education: BA  . Highest education level: Not on file  Occupational History  . Occupation: n/a  Social Needs  . Financial resource strain: Not on file  . Food insecurity:    Worry: Not on file    Inability: Not on file  . Transportation needs:    Medical: Not on file    Non-medical: Not on file  Tobacco Use  . Smoking status: Former Games developer  . Smokeless tobacco: Never Used  Substance and Sexual Activity  . Alcohol use: Not Currently  . Drug use: Never  . Sexual activity: Yes    Partners: Male  Lifestyle  . Physical activity:    Days per week: Not on file    Minutes per session: Not on file  . Stress: Not on file  Relationships  . Social connections:    Talks on phone: Not on file    Gets together: Not on file    Attends religious service:  Not on file    Active member of club or organization: Not on file    Attends meetings of clubs or organizations: Not on file    Relationship status: Not on file  . Intimate partner violence:    Fear of current or ex partner: Not on file    Emotionally abused: Not on file    Physically abused: Not on file    Forced sexual activity: Not on file  Other Topics Concern  . Not on file  Social History Narrative   Marital status/children/pets: married, 1 child   Safety:      -smoke alarm in the home:Yes     - wears seatbelt: Yes     - Feels safe in their relationships: Yes      Right handed    Caffeine use: tea daily     PHYSICAL EXAM  Vitals:   03/27/18 1325  BP: 119/70  Pulse: 91  SpO2: 98%  Weight: 159 lb (72.1 kg)  Height: 5' 4.75" (1.645 m)    Body mass index is 26.66 kg/m.   General: The patient is well-developed and well-nourished and in no acute distress  Eyes:  Funduscopic exam shows normal optic discs and retinal vessels.  Neck: The neck is supple, no carotid bruits are noted.  The neck is nontender.  Cardiovascular: The heart has a regular rate and rhythm with a normal S1 and S2. There were no murmurs, gallops or rubs. Lungs are clear to auscultation.  Skin: Extremities are without significant edema.  Musculoskeletal:  Back is nontender  Neurologic Exam  Mental status: The patient is alert and oriented x 3 at the time of the examination. The patient has apparent normal recent and remote memory, with an apparently normal attention span and concentration ability.   Speech is normal.  Cranial nerves: Extraocular movements are full. Pupils are equal, round, and reactive to light and accomodation.  Visual fields  are full.  Facial symmetry is present. There is good facial sensation to soft touch bilaterally.Facial strength is normal.  Trapezius and sternocleidomastoid strength is normal. No dysarthria is noted.  The tongue is midline, and the patient has symmetric  elevation of the soft palate. No obvious hearing deficits are noted.  Motor:  Muscle bulk is normal.   Tone is normal. Strength is  5 / 5 in all 4 extremities.   Sensory: Sensory testing is intact to pinprick, soft touch and vibration sensation in all 4 extremities.  Coordination: Cerebellar testing reveals good finger-nose-finger and heel-to-shin bilaterally.  Gait and station: Station is normal.   Gait is normal. Tandem gait is normal. Romberg is negative.   Reflexes: Deep tendon reflexes are symmetric and normal bilaterally.   Plantar responses are flexor.    DIAGNOSTIC DATA (LABS, IMAGING, TESTING) - I reviewed patient records, labs, notes, testing and imaging myself where available.  Lab Results  Component Value Date   WBC 5.6 12/27/2017   HGB 13.6 12/27/2017   HCT 39.9 12/27/2017   MCV 89.6 12/27/2017   PLT 315.0 12/27/2017      Component Value Date/Time   NA 138 12/27/2017 1007   NA 138 07/14/2016   K 4.3 12/27/2017 1007   CL 105 12/27/2017 1007   CO2 24 12/27/2017 1007   GLUCOSE 85 12/27/2017 1007   BUN 16 12/27/2017 1007   BUN 15 07/14/2016   CREATININE 0.68 12/27/2017 1007   CALCIUM 9.4 12/27/2017 1007   PROT 7.4 12/27/2017 1007   ALBUMIN 4.3 12/27/2017 1007   AST 13 12/27/2017 1007   ALT 14 12/27/2017 1007   ALKPHOS 53 12/27/2017 1007   BILITOT 0.4 12/27/2017 1007   Lab Results  Component Value Date   CHOL 182 12/27/2017   HDL 74.30 12/27/2017   LDLCALC 76 12/27/2017   TRIG 157.0 (H) 12/27/2017   CHOLHDL 2 12/27/2017   Lab Results  Component Value Date   HGBA1C 5.6 12/27/2017   No results found for: [id number] Lab Results  Component Value Date   TSH 1.02 12/27/2017       ASSESSMENT AND PLAN  Multiple sclerosis (HCC)  History of optic neuritis  High risk medication use  In summary, Ms. Motola is a 48 year old woman with multiple sclerosis diagnosed 14+ years ago who has been stable on Avonex initially and Plegridy now.  I discussed  with her that she is fortunate to have a very low plaque burden with just a half dozen small foci in the brain and the lesion in the optic nerve from her right optic neuritis.  She has been stable on Plegridy.  I recommend that we continue on that medication.  Additionally, she is advised to continue to take vitamin D over-the-counter as low vitamin D has been associated with more issues with MS.    She is also advised to stay active and exercise as tolerated.   Sometime later this year or next year we will need to check another MRI of the brain to make sure that she is not having subclinical progression.  As long as she continues to do well she could continue on Plegridy.  If she does go 20+ years without any exacerbation and no changes on the MRI we could also consider stopping treatment in the future.  She will return to see me in 6 months or sooner if there are new or worsening neurologic symptoms.  Thank you for asking me to see Ms. Janee Morn.  Please  let me know if I can be of further assistance with her or other patients in the future.   Yasmina Chico A. Epimenio Foot, MD, [facility] 03/27/2018, 5:34 PM Certified in Neurology, Clinical Neurophysiology, Sleep Medicine, Pain Medicine and Neuroimaging  [facility] [address] [place], [place] [address] [phone]

## 2018-05-20 ENCOUNTER — Encounter: Payer: Self-pay | Admitting: Family Medicine

## 2018-05-21 NOTE — Telephone Encounter (Signed)
My chart message from Patient: Hello Dr. Claiborne Billings, I hope this finds you well. I have seen in the news some reports about severe stroke with COVID-19. Should I stop taking my birth control pill for now? I know there is a risk of stroke while taking those. I take them for very heavy periods. Thank you for your time, Taylor Clements  Please advise

## 2018-08-22 ENCOUNTER — Encounter: Payer: Self-pay | Admitting: Family Medicine

## 2018-08-23 NOTE — Telephone Encounter (Signed)
Pt wrote My chart message and spoke with on call nurse. Pt was called this morning and states she is still thirsty with feeling weak but is feeling better. She has not had BM since last night. She does not think she needs appt unless Dr Raoul Pitch thinks it is a urgent matter due to the iron. Denies fever, SOB, or vomiting.  She is not taking any iron or multivitamins today and says she will not be taking the iron anymore as she thinks this was the cause. Pt was told message would be sent to Dr Raoul Pitch to review.   Please advise

## 2018-08-23 NOTE — Telephone Encounter (Signed)
Reviewed. Would advise pt have an appt if still having symptoms or desires medical advice.   Of note: chart reviewed and pt not told to start iron. Last seen 8 months ago for a CPE. She has  Not been anemic and an iron panel has never been completed on pt. She may have misunderstood that some patient's with heavy cycles become anemic and need iron. But she has never proven to need it, nor have we evaluated her for a need of iron.

## 2018-09-21 ENCOUNTER — Ambulatory Visit (INDEPENDENT_AMBULATORY_CARE_PROVIDER_SITE_OTHER): Payer: Managed Care, Other (non HMO)

## 2018-09-21 ENCOUNTER — Ambulatory Visit: Payer: Managed Care, Other (non HMO) | Admitting: Podiatry

## 2018-09-21 ENCOUNTER — Ambulatory Visit: Payer: Managed Care, Other (non HMO)

## 2018-09-21 ENCOUNTER — Encounter: Payer: Self-pay | Admitting: Podiatry

## 2018-09-21 ENCOUNTER — Other Ambulatory Visit: Payer: Self-pay

## 2018-09-21 ENCOUNTER — Other Ambulatory Visit: Payer: Self-pay | Admitting: Podiatry

## 2018-09-21 VITALS — BP 104/77 | HR 84 | Resp 16

## 2018-09-21 DIAGNOSIS — M25572 Pain in left ankle and joints of left foot: Secondary | ICD-10-CM

## 2018-09-21 DIAGNOSIS — M79672 Pain in left foot: Secondary | ICD-10-CM | POA: Diagnosis not present

## 2018-09-21 DIAGNOSIS — M722 Plantar fascial fibromatosis: Secondary | ICD-10-CM

## 2018-09-21 MED ORDER — DICLOFENAC SODIUM 75 MG PO TBEC: 75.0000 mg | DELAYED_RELEASE_TABLET | Freq: Two times a day (BID) | ORAL | 2 refills | Status: DC

## 2018-09-21 NOTE — Patient Instructions (Signed)

## 2018-09-21 NOTE — Progress Notes (Signed)
   Subjective:    Patient ID: Taylor Clements, female    DOB: Jun 01, 1970, 48 y.o.   MRN: [id number]  HPI    Review of Systems  All other systems reviewed and are negative.      Objective:   Physical Exam        Assessment & Plan:

## 2018-09-24 NOTE — Progress Notes (Signed)
Subjective:   Patient ID: Taylor Clements, female   DOB: 48 y.o.   MRN: [id number]   HPI Patient presents with quite a bit of discomfort plantar aspect left heel stating it is been going on for around 5 months and it seems to radiate into the ankle.  Patient states it is gradually gotten worse over that time and patient does not smoke likes to be active   Review of Systems  All other systems reviewed and are negative.       Objective:  Physical Exam Vitals signs and nursing note reviewed.  Constitutional:      Appearance: She is well-developed.  Pulmonary:     Effort: Pulmonary effort is normal.  Musculoskeletal: Normal range of motion.  Skin:    General: Skin is warm.  Neurological:     Mental Status: She is alert.     Neurovascular status found to be intact muscle strength found to be adequate range of motion within normal limits.  Patient is found to have exquisite discomfort plantar aspect left heel at the insertional point of the tendon into the calcaneus with inflammation and fluid around the medial band.  Patient is noted to have mild discomfort in the ankle but localized     Assessment:  Acute plantar fasciitis left with irritation of the tendon at its insertion with no indications of other pathology     Plan:  H&P conditions reviewed x-ray reviewed.  I injected the left plantar fascial 3 mg Kenalog 5 mg Xylocaine applied fascial brace with instructions on use is placed on diclofenac and gave instructions for physical therapy.  Reappoint to recheck 2 weeks  X-ray indicates that there is no indications of spur or stress fracture associated with this condition

## 2018-09-27 ENCOUNTER — Telehealth: Payer: Self-pay | Admitting: *Deleted

## 2018-09-27 ENCOUNTER — Encounter: Payer: Self-pay | Admitting: Neurology

## 2018-09-27 ENCOUNTER — Telehealth (INDEPENDENT_AMBULATORY_CARE_PROVIDER_SITE_OTHER): Payer: Managed Care, Other (non HMO) | Admitting: Neurology

## 2018-09-27 DIAGNOSIS — G35 Multiple sclerosis: Secondary | ICD-10-CM | POA: Diagnosis not present

## 2018-09-27 DIAGNOSIS — Z79899 Other long term (current) drug therapy: Secondary | ICD-10-CM

## 2018-09-27 DIAGNOSIS — Z8669 Personal history of other diseases of the nervous system and sense organs: Secondary | ICD-10-CM | POA: Diagnosis not present

## 2018-09-27 MED ORDER — PLEGRIDY 125 MCG/0.5ML ~~LOC~~ SOPN: 125.0000 ug | PEN_INJECTOR | SUBCUTANEOUS | 4 refills | Status: DC

## 2018-09-27 NOTE — Progress Notes (Signed)
[facility]  PATIENT: Taylor BrothersDawn Marie Clements DOB: 25-Feb-1970  REFERRING DOCTOR OR PCP:  Felix Pacinienee Kuneff SOURCE: patient, notes from Dr. Claiborne BillingsKuneff,   _________________________________   HISTORICAL  CHIEF COMPLAINT:  She is a 48 y.o. woman with relapsing remitting multiple sclerosis.  HISTORY OF PRESENT ILLNESS:  Su MonksDawn Clements is a 48 y.o. woman with multiple sclerosis.      Update 09/27/2018: Virtual Visit via Video Note  I connected with Taylor Brothersawn Marie Clements on 09/27/18 at 11:00 AM EDT by a video enabled telemedicine application and verified that I am speaking with the correct person using two identifiers.  Location: Patient: home Provider: office   I discussed the limitations of evaluation and management by telemedicine and the availability of in person appointments. The patient expressed understanding and agreed to proceed.  History of Present Illness: She feels her MS has been stable.   She is on Plegridy and generally tolerates it well with some mild skin reactions.      Gait and balance are doing well.   Strength and sensation are doing well.    She has some urgency and only a few episodes of incontinence in last decade.  Vision is doing well.     She is sometimes sleepy and tired.   She does not take naps but sometimes feels like she could.   No snoring.    She denies depression but has felt a little anxious at times with Covid.   She feels cognition is ok but sometimes has    She had some foot pain and saw podiatry.   She had an injection with benefit for plantar.    She was prescribed Voltaren 75 mg bid.    Observations/Objective:  She is a well-developed well-nourished woman in no acute distress.  The head is normocephalic and atraumatic.  Sclera are anicteric.  Visible skin appears normal.  The neck has a good range of motion.    She is alert and fully oriented with fluent speech and good attention, knowledge and memory.  Extraocular muscles are intact.   Facial strength is normal.    She appears to have normal strength in the arms.  Rapid alternating movements and finger-nose-finger are performed well.  Assessment and Plan: 1. Multiple sclerosis (HCC)   2. History of optic neuritis   3. High risk medication use     1.   Continue Plegridy.  She will get blood work in a month or 2 when she sees her primary care physician. 2.   Next year, we will check an MRI of the brain to determine if there is any subclinical progression.  Her MS has been mild and hopefully she will continue to be stable.  If progression is noted, however, we would need to consider a stronger disease modifying therapy. 3.    Stay active and exercise as tolerated.  Continue to use follow CDC guidelines doing the COVID pandemic. 4.    Return in 1 year or sooner if there are new or worsening neurologic symptoms.   Follow Up Instructions: I discussed the assessment and treatment plan with the patient. The patient was provided an opportunity to ask questions and all were answered. The patient agreed with the plan and demonstrated an understanding of the instructions.   The patient was advised to call back or seek an in-person evaluation if the symptoms worsen or if the condition fails to improve as anticipated.  I provided 25 minutes of non-face-to-face time during this encounter.  _____________________________________________ From 03/27/2018 initial consult: She is a 48 year old woman who was diagnosed with MS in late 2005.  She first presented with right optic neuritis that year.   She was treated with several days of IV Solu-Medrol and vision improved.   An MRI was consistent with MS.   She was initially placed on Avonex late 2005 or early 2006.  Around 2014, after Plegridy was approved, she switched from Avonex to Plegridy.   She has no definite exacerbation.  However, about 5 years ago she began to notes spasticity in her right arm and more fatigue.     She was placed on baclofen  but only took it about a month or less.    The spasticity improved.     She has not been on any treatment for her fatigue.   Currently, gait and balance are fine.   She could climb a ladder.    She denies any numbness or weakness in the limbs.    She denies any current bladder issues but has had some urinary urgency at times in the past and had a couple episodes of urge incontinence.  She has intermittent diarrhea and takes hyoscyamine prn.   This is worse with heat.    Vision returned to baseline initially.   More recently she notes some blurry vision with reading.        She has fatigue daily.    She yawns some but does not doze off much.   She has never been on Provigil or other medication for this.       She sleeps well most nights getting abou 8 hours many nights.     She does not snore.    She denies depression.     She feels cognition is usually good but mild reduced focus and attention.       She has some pain in the bottom of the left foot, most likely to other her when she walks barefoot after sleeping or resting a while.   It improves after moving around or putting on shoes.    I personally reviewed the MRI of the brain performed in 2018.  It shows 6 or 7 small T2/flair hyperintense foci, 2 in the periventricular white matter and the rest in the subcortical and deep white matter.  None of the foci appear to be acute.  There was no atrophy.  There are no lesions in the infratentorial white matter.  None of the foci enhance.  She has no FH of MS or other autoimmune disorder.        REVIEW OF SYSTEMS: Constitutional: No fevers, chills, sweats, or change in appetite.  She has fatigue.    Sleeps well Eyes: No visual changes, double vision, eye pain Ear, nose and throat: No hearing loss, ear pain, nasal congestion, sore throat Cardiovascular: No chest pain, palpitations Respiratory: No shortness of breath at rest or with exertion.   No wheezes GastrointestinaI: No nausea, vomiting, abdominal  pain, fecal incontinence.  Occ diarrhea, maybe IBS.  Genitourinary: No dysuria,.  No nocturia.  Some urgency Musculoskeletal: No neck pain, back pain Integumentary: No rash, pruritus, skin lesions Neurological: as above Psychiatric: No depression at this time.  No anxiety Endocrine: No palpitations, diaphoresis, change in appetite, change in weigh or increased thirst Hematologic/Lymphatic: No anemia, purpura, petechiae. Allergic/Immunologic: No itchy/runny eyes, nasal congestion, recent allergic reactions, rashes  ALLERGIES: No Known Allergies  HOME MEDICATIONS:  Current Outpatient Medications:  .  Calcium 500-125 MG-UNIT TABS,  Take 2 tablets by mouth daily., Disp: , Rfl:  .  diclofenac (VOLTAREN) 75 MG EC tablet, Take 1 tablet (75 mg total) by mouth 2 (two) times daily., Disp: 50 tablet, Rfl: 2 .  hyoscyamine (LEVSIN SL) 0.125 MG SL tablet, Place 1 tablet (0.125 mg total) under the tongue every 4 (four) hours as needed., Disp: 90 tablet, Rfl: 1 .  LORazepam (ATIVAN) 0.5 MG tablet, Take 0.5 mg by mouth every 8 (eight) hours. 1-2 tabs prior to flight for anxiety, Disp: , Rfl:  .  Multiple Vitamin (MULTIVITAMIN) capsule, Take 1 capsule by mouth daily., Disp: , Rfl:  .  Olopatadine HCl 0.2 % SOLN, Apply 1 drop to eye daily., Disp: , Rfl:  .  Omega-3 Fatty Acids (FISH OIL) 1200 MG CAPS, Take 1 capsule by mouth 2 (two) times daily., Disp: , Rfl:  .  Peginterferon Beta-1a (PLEGRIDY) 125 MCG/0.5ML SOPN, Inject 125 mcg into the skin every 14 (fourteen) days., Disp: 6 pen, Rfl: 4  PAST MEDICAL HISTORY: Past Medical History:  Diagnosis Date  . Actinic keratitis 12/07/2016   shaved, deep margins- pt declined further biospy, aldara and  Excel V laser completed by derm  . Allergy   . Chicken pox   . Idiopathic chronic venous hypertension of both legs with inflammation   . Menorrhagia    control on BCP  . Multiple sclerosis (HCC) 2005   diagnosed 2005; after optic Neuritis. Has IBS, fatiue  and right arm paresthesia w/ flares  . Optic neuritis due to multiple sclerosis (HCC) 2007   loss vision; IV steroids --> vision returned.   . Phobia, flying    uses ativan with flights  . Post partum depression   . Venous insufficiency (chronic) (peripheral)     PAST SURGICAL HISTORY: Past Surgical History:  Procedure Laterality Date  . DILATION AND EVACUATION  2003   nonviable pregnancy  . image: duplex LLE Left 05/16/2016   scanned into media.   . Image: MRI Brain  07/10/2017   Stable minor subcortical & periventricular white matter changes; MS  . LASER ABLATION Left 2014   left greater saphenous vein, then endoevous chemical ablation and sclerotherpay.   . procedure: Excel V laser  12/07/2016   Ak tip of nose shaved-deep margins-pt declined deeper bx, aldara cream and laser completed by derm.     FAMILY HISTORY: Family History  Problem Relation Age of Onset  . Breast cancer Mother   . Asthma Brother   . Depression Brother   . Drug abuse Brother   . Learning disabilities Brother   . Stroke Maternal Grandmother     SOCIAL HISTORY:  Social History   Socioeconomic History  . Marital status: Married    Spouse name: Aurther Lofterry  . Number of children: 1  . Years of education: BA  . Highest education level: Not on file  Occupational History  . Occupation: n/a  Social Needs  . Financial resource strain: Not on file  . Food insecurity    Worry: Not on file    Inability: Not on file  . Transportation needs    Medical: Not on file    Non-medical: Not on file  Tobacco Use  . Smoking status: Former Games developermoker  . Smokeless tobacco: Never Used  Substance and Sexual Activity  . Alcohol use: Not Currently  . Drug use: Never  . Sexual activity: Yes    Partners: Male  Lifestyle  . Physical activity    Days per week: Not on  file    Minutes per session: Not on file  . Stress: Not on file  Relationships  . Social Musicianconnections    Talks on phone: Not on file    Gets together:  Not on file    Attends religious service: Not on file    Active member of club or organization: Not on file    Attends meetings of clubs or organizations: Not on file    Relationship status: Not on file  . Intimate partner violence    Fear of current or ex partner: Not on file    Emotionally abused: Not on file    Physically abused: Not on file    Forced sexual activity: Not on file  Other Topics Concern  . Not on file  Social History Narrative   Marital status/children/pets: married, 1 child   Safety:      -smoke alarm in the home:Yes     - wears seatbelt: Yes     - Feels safe in their relationships: Yes      Right handed    Caffeine use: tea daily     PHYSICAL EXAM (from 03/27/2018 initial consult)  There were no vitals filed for this visit.  There is no height or weight on file to calculate BMI.   General: The patient is well-developed and well-nourished and in no acute distress  Eyes:  Funduscopic exam shows normal optic discs and retinal vessels.  Neck: The neck is supple, no carotid bruits are noted.  The neck is nontender.  Cardiovascular: The heart has a regular rate and rhythm with a normal S1 and S2. There were no murmurs, gallops or rubs. Lungs are clear to auscultation.  Skin: Extremities are without significant edema.  Musculoskeletal:  Back is nontender  Neurologic Exam  Mental status: The patient is alert and oriented x 3 at the time of the examination. The patient has apparent normal recent and remote memory, with an apparently normal attention span and concentration ability.   Speech is normal.  Cranial nerves: Extraocular movements are full. Pupils are equal, round, and reactive to light and accomodation.  Visual fields are full.  Facial symmetry is present. There is good facial sensation to soft touch bilaterally.Facial strength is normal.  Trapezius and sternocleidomastoid strength is normal. No dysarthria is noted.  The tongue is midline, and the  patient has symmetric elevation of the soft palate. No obvious hearing deficits are noted.  Motor:  Muscle bulk is normal.   Tone is normal. Strength is  5 / 5 in all 4 extremities.   Sensory: Sensory testing is intact to pinprick, soft touch and vibration sensation in all 4 extremities.  Coordination: Cerebellar testing reveals good finger-nose-finger and heel-to-shin bilaterally.  Gait and station: Station is normal.   Gait is normal. Tandem gait is normal. Romberg is negative.   Reflexes: Deep tendon reflexes are symmetric and normal bilaterally.   Plantar responses are flexor.      Richard A. Epimenio FootSater, MD, [facility],FAAN 09/27/2018, 11:33 AM Certified in Neurology, Clinical Neurophysiology, Sleep Medicine, Pain Medicine and Neuroimaging  [facility] [address], [address] [place], [place] [address] [phone]

## 2018-09-27 NOTE — Telephone Encounter (Signed)
Called pt. Updated med list, pharamcy, allergies on file for mychart video visit at 11am. She was able to do self test for mychart and had no issues. She will log on about 10:50am. Nothing further needed.

## 2018-10-05 ENCOUNTER — Encounter: Payer: Self-pay | Admitting: Podiatry

## 2018-10-05 ENCOUNTER — Other Ambulatory Visit: Payer: Self-pay

## 2018-10-05 ENCOUNTER — Ambulatory Visit: Payer: Managed Care, Other (non HMO) | Admitting: Podiatry

## 2018-10-05 DIAGNOSIS — M722 Plantar fascial fibromatosis: Secondary | ICD-10-CM

## 2018-10-06 NOTE — Progress Notes (Signed)
Subjective:   Patient ID: Trudie Buckler, female   DOB: 48 y.o.   MRN: [id number]   HPI Patient states heel is doing great with minimal discomfort currently   ROS      Objective:  Physical Exam  Neurovascular status intact with significant reduction of pain with pain only present upon deep palpation but much better than previous     Assessment:  Doing well post plantar fasciitis left with pain receded     Plan:  H&P condition reviewed and recommended ice therapy heel lift therapy and over-the-counter insoles.  Reappoint for [place] to recheck if symptoms persist or get worse

## 2018-10-10 NOTE — Telephone Encounter (Signed)
Continue for a couple of weeks

## 2019-01-16 ENCOUNTER — Encounter: Payer: Self-pay | Admitting: Family Medicine

## 2019-01-30 NOTE — Telephone Encounter (Signed)
Received fax from covermymeds to complete PA. I submitted urgent PA. EYE:[id number]. Received instant approval. CaseId:[id number];Status:Approved;Review Type:Prior Auth;Coverage Start Date:01/25/2019;Coverage End Date:01/29/2022;

## 2019-03-04 ENCOUNTER — Other Ambulatory Visit: Payer: Self-pay | Admitting: Neurology

## 2019-03-04 MED ORDER — INDOMETHACIN 25 MG PO CAPS: 25.0000 mg | ORAL_CAPSULE | Freq: Three times a day (TID) | ORAL | 2 refills | Status: DC | PRN

## 2019-03-06 ENCOUNTER — Encounter: Payer: Self-pay | Admitting: Family Medicine

## 2019-03-08 ENCOUNTER — Encounter: Payer: Self-pay | Admitting: Family Medicine

## 2019-03-08 ENCOUNTER — Ambulatory Visit: Payer: Managed Care, Other (non HMO) | Admitting: Family Medicine

## 2019-03-08 ENCOUNTER — Other Ambulatory Visit: Payer: Self-pay

## 2019-03-08 VITALS — BP 131/84 | HR 101 | Temp 98.7°F | Resp 17 | Ht 65.0 in | Wt 159.1 lb

## 2019-03-08 DIAGNOSIS — M79675 Pain in left toe(s): Secondary | ICD-10-CM | POA: Diagnosis not present

## 2019-03-08 DIAGNOSIS — R42 Dizziness and giddiness: Secondary | ICD-10-CM

## 2019-03-08 DIAGNOSIS — G8929 Other chronic pain: Secondary | ICD-10-CM | POA: Insufficient documentation

## 2019-03-08 DIAGNOSIS — R110 Nausea: Secondary | ICD-10-CM | POA: Diagnosis not present

## 2019-03-08 LAB — COMPREHENSIVE METABOLIC PANEL
ALT: 17 U/L (ref 0–35)
AST: 14 U/L (ref 0–37)
Albumin: 4.2 g/dL (ref 3.5–5.2)
Alkaline Phosphatase: 66 U/L (ref 39–117)
BUN: 17 mg/dL (ref 6–23)
CO2: 28 mEq/L (ref 19–32)
Calcium: 9.3 mg/dL (ref 8.4–10.5)
Chloride: 104 mEq/L (ref 96–112)
Creatinine, Ser: 0.69 mg/dL (ref 0.40–1.20)
GFR: 90.53 mL/min (ref >60.00)
Glucose, Bld: 85 mg/dL (ref 70–99)
Potassium: 4.3 mEq/L (ref 3.5–5.1)
Sodium: 139 mEq/L (ref 135–145)
Total Bilirubin: 0.4 mg/dL (ref 0.2–1.2)
Total Protein: 7.4 g/dL (ref 6.0–8.3)

## 2019-03-08 LAB — CBC
HCT: 37.8 % (ref 36.0–46.0)
Hemoglobin: 12.8 g/dL (ref 12.0–15.0)
MCHC: 33.8 g/dL (ref 30.0–36.0)
MCV: 87.9 fl (ref 78.0–100.0)
Platelets: 267 10*3/uL (ref 150.0–400.0)
RBC: 4.3 Mil/uL (ref 3.87–5.11)
RDW: 13.4 % (ref 11.5–15.5)
WBC: 6.4 10*3/uL (ref 4.0–10.5)

## 2019-03-08 LAB — H. PYLORI ANTIBODY, IGG: H Pylori IgG: NEGATIVE

## 2019-03-08 LAB — URIC ACID: Uric Acid, Serum: 4.8 mg/dL (ref 2.4–7.0)

## 2019-03-08 MED ORDER — FAMOTIDINE 20 MG PO TABS: 20.0000 mg | ORAL_TABLET | Freq: Two times a day (BID) | ORAL | 1 refills | Status: DC

## 2019-03-08 MED ORDER — OMEPRAZOLE 20 MG PO CPDR: 20.0000 mg | DELAYED_RELEASE_CAPSULE | Freq: Every day | ORAL | 3 refills | Status: DC

## 2019-03-08 NOTE — Patient Instructions (Addendum)
Start omeprazole daily and pepcid twice a day.  Take a zyrtec or allegra (once daily) 2 days prior and after your injection.   We will call you with lab results and discuss further plan.    Gout  Gout is painful swelling of your joints. Gout is a type of arthritis. It is caused by having too much uric acid in your body. Uric acid is a chemical that is made when your body breaks down substances called purines. If your body has too much uric acid, sharp crystals can form and build up in your joints. This causes pain and swelling. Gout attacks can happen quickly and be very painful (acute gout). Over time, the attacks can affect more joints and happen more often (chronic gout). What are the causes?  Too much uric acid in your blood. This can happen because: ? Your kidneys do not remove enough uric acid from your blood. ? Your body makes too much uric acid. ? You eat too many foods that are high in purines. These foods include organ meats, some seafood, and beer.  Trauma or stress. What increases the risk?  Having a family history of gout.  Being female and middle-aged.  Being female and having gone through menopause.  Being very overweight (obese).  Drinking alcohol, especially beer.  Not having enough water in the body (being dehydrated).  Losing weight too quickly.  Having an organ transplant.  Having lead poisoning.  Taking certain medicines.  Having kidney disease.  Having a skin condition called psoriasis. What are the signs or symptoms? An attack of acute gout usually happens in just one joint. The most common place is the big toe. Attacks often start at night. Other joints that may be affected include joints of the feet, ankle, knee, fingers, wrist, or elbow. Symptoms of an attack may include:  Very bad pain.  Warmth.  Swelling.  Stiffness.  Shiny, red, or purple skin.  Tenderness. The affected joint may be very painful to touch.  Chills and fever. Chronic  gout may cause symptoms more often. More joints may be involved. You may also have white or yellow lumps (tophi) on your hands or feet or in other areas near your joints. How is this treated?  Treatment for this condition has two phases: treating an acute attack and preventing future attacks.  Acute gout treatment may include: ? NSAIDs. ? Steroids. These are taken by mouth or injected into a joint. ? Colchicine. This medicine relieves pain and swelling. It can be given by mouth or through an IV tube.  Preventive treatment may include: ? Taking small doses of NSAIDs or colchicine daily. ? Using a medicine that reduces uric acid levels in your blood. ? Making changes to your diet. You may need to see a food expert (dietitian) about what to eat and drink to prevent gout. Follow these instructions at home: During a gout attack   If told, put ice on the painful area: ? Put ice in a plastic bag. ? Place a towel between your skin and the bag. ? Leave the ice on for 20 minutes, 2-3 times a day.  Raise (elevate) the painful joint above the level of your heart as often as you can.  Rest the joint as much as possible. If the joint is in your leg, you may be given crutches.  Follow instructions from your doctor about what you cannot eat or drink. Avoiding future gout attacks  Eat a low-purine diet. Avoid foods and  drinks such as: ? Liver. ? Kidney. ? Anchovies. ? Asparagus. ? Herring. ? Mushrooms. ? Mussels. ? Beer.  Stay at a healthy weight. If you want to lose weight, talk with your doctor. Do not lose weight too fast.  Start or continue an exercise plan as told by your doctor. Eating and drinking  Drink enough fluids to keep your pee (urine) pale yellow.  If you drink alcohol: ? Limit how much you use to:  0-1 drink a day for women.  0-2 drinks a day for men. ? Be aware of how much alcohol is in your drink. In the [place] one drink equals one 12 oz bottle of beer (355 mL),  one 5 oz glass of wine (148 mL), or one 1 oz glass of hard liquor (44 mL). General instructions  Take over-the-counter and prescription medicines only as told by your doctor.  Do not drive or use heavy machinery while taking prescription pain medicine.  Return to your normal activities as told by your doctor. Ask your doctor what activities are safe for you.  Keep all follow-up visits as told by your doctor. This is important. Contact a doctor if:  You have another gout attack.  You still have symptoms of a gout attack after 10 days of treatment.  You have problems (side effects) because of your medicines.  You have chills or a fever.  You have burning pain when you pee (urinate).  You have pain in your lower back or belly. Get help right away if:  You have very bad pain.  Your pain cannot be controlled.  You cannot pee. Summary  Gout is painful swelling of the joints.  The most common site of pain is the big toe, but it can affect other joints.  Medicines and avoiding some foods can help to prevent and treat gout attacks. This information is not intended to replace advice given to you by your health care provider. Make sure you discuss any questions you have with your health care provider. Document Revised: 08/02/2017 Document Reviewed: 08/02/2017 Elsevier Patient Education  2020 Elsevier Inc.   Low-Purine Eating Plan A low-purine eating plan involves making food choices to limit your intake of purine. Purine is a kind of uric acid. Too much uric acid in your blood can cause certain conditions, such as gout and kidney stones. Eating a low-purine diet can help control these conditions. What are tips for following this plan? Reading food labels   Avoid foods with saturated or Trans fat.  Check the ingredient list of grains-based foods, such as bread and cereal, to make sure that they contain whole grains.  Check the ingredient list of sauces or soups to make sure  they do not contain meat or fish.  When choosing soft drinks, check the ingredient list to make sure they do not contain high-fructose corn syrup. Shopping  Buy plenty of fresh fruits and vegetables.  Avoid buying canned or fresh fish.  Buy dairy products labeled as low-fat or nonfat.  Avoid buying premade or processed foods. These foods are often high in fat, salt (sodium), and added sugar. Cooking  Use olive oil instead of butter when cooking. Oils like olive oil, canola oil, and sunflower oil contain healthy fats. Meal planning  Learn which foods do or do not affect you. If you find out that a food tends to cause your gout symptoms to flare up, avoid eating that food. You can enjoy foods that do not cause problems. If you  have any questions about a food item, talk with your dietitian or health care provider.  Limit foods high in fat, especially saturated fat. Fat makes it harder for your body to get rid of uric acid.  Choose foods that are lower in fat and are lean sources of protein. General guidelines  Limit alcohol intake to no more than 1 drink a day for nonpregnant women and 2 drinks a day for men. One drink equals 12 oz of beer, 5 oz of wine, or 1 oz of hard liquor. Alcohol can affect the way your body gets rid of uric acid.  Drink plenty of water to keep your urine clear or pale yellow. Fluids can help remove uric acid from your body.  If directed by your health care provider, take a vitamin C supplement.  Work with your health care provider and dietitian to develop a plan to achieve or maintain a healthy weight. Losing weight can help reduce uric acid in your blood. What foods are recommended? The items listed may not be a complete list. Talk with your dietitian about what dietary choices are best for you. Foods low in purines Foods low in purines do not need to be limited. These include:  All fruits.  All low-purine vegetables, pickles, and olives.  Breads, pasta,  rice, cornbread, and popcorn. Cake and other baked goods.  All dairy foods.  Eggs, nuts, and nut butters.  Spices and condiments, such as salt, herbs, and vinegar.  Plant oils, butter, and margarine.  Water, sugar-free soft drinks, tea, coffee, and cocoa.  Vegetable-based soups, broths, sauces, and gravies. Foods moderate in purines Foods moderate in purines should be limited to the amounts listed.   cup of asparagus, cauliflower, spinach, mushrooms, or green peas, each day.  2/3 cup uncooked oatmeal, each day.   cup dry wheat bran or wheat germ, each day.  2-3 ounces of meat or poultry, each day.  4-6 ounces of shellfish, such as crab, lobster, oysters, or shrimp, each day.  1 cup cooked beans, peas, or lentils, each day.  Soup, broths, or bouillon made from meat or fish. Limit these foods as much as possible. What foods are not recommended? The items listed may not be a complete list. Talk with your dietitian about what dietary choices are best for you. Limit your intake of foods high in purines, including:  Beer and other alcohol.  Meat-based gravy or sauce.  Canned or fresh fish, such as: ? Anchovies, sardines, herring, and tuna. ? Mussels and scallops. ? Codfish, trout, and haddock.  Tomasa Blase.  Organ meats, such as: ? Liver or kidney. ? Tripe. ? Sweetbreads (thymus gland or pancreas).  Wild Education officer, environmental.  Yeast or yeast extract supplements.  Drinks sweetened with high-fructose corn syrup. Summary  Eating a low-purine diet can help control conditions caused by too much uric acid in the body, such as gout or kidney stones.  Choose low-purine foods, limit alcohol, and limit foods high in fat.  You will learn over time which foods do or do not affect you. If you find out that a food tends to cause your gout symptoms to flare up, avoid eating that food. This information is not intended to replace advice given to you by your health care provider. Make sure  you discuss any questions you have with your health care provider. Document Revised: 12/23/2016 Document Reviewed: 02/24/2016 Elsevier Patient Education  2020 [employer].

## 2019-03-08 NOTE — Progress Notes (Signed)
This visit occurred during the SARS-CoV-2 public health emergency.  Safety protocols were in place, including screening questions prior to the visit, additional usage of staff PPE, and extensive cleaning of exam room while observing appropriate contact time as indicated for disinfecting solutions.    Taylor Clements , 1970-02-27, 49 y.o., female MRN: [id number] Patient Care Team    Relationship Specialty Notifications Start End  Ma Hillock, DO PCP - General Family Medicine  11/21/17     Chief Complaint  Patient presents with  . Dizziness    Pt get light headed, dizzy, shakey, with nausea for 30 min-1hr. Pt had the same reaction to Iron supplements. She started taking Plegridy.  Foot was swelling after dose.      Subjective: Pt presents for an OV with multiple complaints, which she is not certain if they are related.  Patient states she has noticed she gets nausea, dizzy, shaky and lightheaded about 30 minutes to 1 hour after her Plegridy injection.  She states also she has noticed the last few injections -4-5 days after she gives herself an injection she has pain in her large left toe.  There can be some redness and swelling associated with this discomfort. She had spoke with her neurologist about the above symptoms and they had encouraged her to start indomethacin.  She states she took indomethacin once only.  She did feel that her symptoms resolved quicker after taking the indomethacin.  She also reports routinely using ibuprofen a few days prior and after her injections in preparation of side effects.  She had not started the Pepcid also encouraged by neurology.  She does not have a history of polyarthralgia or gout.  Depression screen [facility] 2/9 12/27/2017 11/21/2017  Decreased Interest 0 0  Down, Depressed, Hopeless 0 0  PHQ - 2 Score 0 0  Altered sleeping 0 -  Tired, decreased energy 1 -  Change in appetite 0 -  Feeling bad or failure about yourself  0 -  Trouble  concentrating 0 -  Moving slowly or fidgety/restless 0 -  Suicidal thoughts 0 -  PHQ-9 Score 1 -  Difficult doing work/chores Not difficult at all -    No Known Allergies Social History   Social History Narrative   Marital status/children/pets: married, 1 child   Safety:      -smoke alarm in the home:Yes     - wears seatbelt: Yes     - Feels safe in their relationships: Yes      Right handed    Caffeine use: tea daily   Past Medical History:  Diagnosis Date  . Actinic keratitis 12/07/2016   shaved, deep margins- pt declined further biospy, aldara and  Excel V laser completed by derm  . Allergy   . Chicken pox   . Idiopathic chronic venous hypertension of both legs with inflammation   . Menorrhagia    control on BCP  . Multiple sclerosis ([place]) 2005   diagnosed 2005; after optic Neuritis. Has IBS, fatiue and right arm paresthesia w/ flares  . Optic neuritis due to multiple sclerosis ([place]) 2007   loss vision; IV steroids --> vision returned.   . Phobia, flying    uses ativan with flights  . Post partum depression   . Venous insufficiency (chronic) (peripheral)    Past Surgical History:  Procedure Laterality Date  . DILATION AND EVACUATION  2003   nonviable pregnancy  . image: duplex LLE Left 05/16/2016   scanned  into media.   . Image: MRI Brain  07/10/2017   Stable minor subcortical & periventricular white matter changes; MS  . LASER ABLATION Left 2014   left greater saphenous vein, then endoevous chemical ablation and sclerotherpay.   . procedure: Excel V laser  12/07/2016   Ak tip of nose shaved-deep margins-pt declined deeper bx, aldara cream and laser completed by derm.    Family History  Problem Relation Age of Onset  . Breast cancer Mother   . Asthma Brother   . Depression Brother   . Drug abuse Brother   . Learning disabilities Brother   . Stroke Maternal Grandmother    Allergies as of 03/08/2019   No Known Allergies     Medication List        Accurate as of March 08, 2019 11:59 PM. If you have any questions, ask your nurse or doctor.        STOP taking these medications   diclofenac 75 MG EC tablet Commonly known as: VOLTAREN Stopped by: Howard Pouch, DO   indomethacin 25 MG capsule Commonly known as: INDOCIN Stopped by: Howard Pouch, DO     TAKE these medications   Calcium 500-125 MG-UNIT Tabs Take 2 tablets by mouth daily.   famotidine 20 MG tablet Commonly known as: Pepcid Take 1 tablet (20 mg total) by mouth 2 (two) times daily. Started by: Howard Pouch, DO   Fish Oil 1200 MG Caps Take 1 capsule by mouth 2 (two) times daily.   hyoscyamine 0.125 MG SL tablet Commonly known as: LEVSIN SL Place 1 tablet (0.125 mg total) under the tongue every 4 (four) hours as needed.   LORazepam 0.5 MG tablet Commonly known as: ATIVAN Take 0.5 mg by mouth every 8 (eight) hours. 1-2 tabs prior to flight for anxiety   multivitamin capsule Take 1 capsule by mouth daily.   Olopatadine HCl 0.2 % Soln Apply 1 drop to eye daily.   omeprazole 20 MG capsule Commonly known as: PRILOSEC Take 1 capsule (20 mg total) by mouth daily. Started by: Howard Pouch, DO   Plegridy 125 MCG/0.5ML Sopn Generic drug: Peginterferon Beta-1a Inject 125 mcg into the skin every 14 (fourteen) days.       All past medical history, surgical history, allergies, family history, immunizations andmedications were updated in the EMR today and reviewed under the history and medication portions of their EMR.     ROS: Negative, with the exception of above mentioned in HPI   Objective:  BP 131/84 (BP Location: Right Arm, Patient Position: Sitting, Cuff Size: Normal)   Pulse (!) 101   Temp 98.7 F (37.1 C) (Temporal)   Resp 17   Ht '5\' 5"'  (1.651 m)   Wt 159 lb 2 oz (72.2 kg)   LMP 02/24/2019 (Exact Date)   SpO2 100%   BMI 26.48 kg/m  Body mass index is 26.48 kg/m. Gen: Afebrile. No acute distress. Nontoxic in appearance, well developed,  well nourished.  HENT: AT. [place].  MMM Eyes:Pupils Equal Round Reactive to light, Extraocular movements intact,  Conjunctiva without redness, discharge or icterus. Neck/lymp/endocrine: Supple, no lymphadenopathy CV: RRR  Chest: CTAB, no wheeze or crackles.  Abd: Soft.  Flat. NTND. BS present.  No masses palpated.  MSK: No erythema, no soft tissue swelling.  Full range of motion.  Mildly tender to palpation at first metatarsal joint. Skin: no rashes, purpura or petechiae.  Neuro:  Normal gait. PERLA. EOMi. Alert. Oriented x3  Psych: Normal affect, dress and  demeanor. Normal speech. Normal thought content and judgment.  No exam data present No results found. No results found for this or any previous visit (from the past 24 hour(s)).  Assessment/Plan: Taylor Clements is a 49 y.o. female present for OV for  Toe pain, chronic, left Uncertain etiology of her toe discomfort.  Swelling seems to be a pattern that is occurring a few days after her Plegridy injections.  Plegridy does have a side effect of increasing body temperature which could be causing an  erythromelalgia reaction.   -We will rule out gout.  Uric acid levels will be collected today.  Patient also encouraged that if uric acids are normal, would also encourage UA levels when she has symptoms. - Uric acid  Nausea/lightheaded -Nausea and dizziness very well could be a reaction to her Plegridy injection.  Would encourage her to discuss further with her neurologist.  Would need to waive the benefits versus risk since the medication seems to be working well for her MS. -Discussed with her the GI symptoms could be secondary to the NSAIDs she uses around the time of her injections. -Encouraged her to start Prilosec and Pepcid. -Also encouraged to start an antihistamine daily -Labs today to rule out anemia, gallbladder or H. pylori. - Comp Met (CMET) - H. pylori antibody, IgG - CBC Follow-up dependent upon lab results.  Patient has  her physical scheduled for next week.    Reviewed expectations re: course of current medical issues.  Discussed self-management of symptoms.  Outlined signs and symptoms indicating need for more acute intervention.  Patient verbalized understanding and all questions were answered.  Patient received an After-Visit Summary.    Orders Placed This Encounter  Procedures  . Comp Met (CMET)  . Uric acid  . H. pylori antibody, IgG  . CBC   Meds ordered this encounter  Medications  . famotidine (PEPCID) 20 MG tablet    Sig: Take 1 tablet (20 mg total) by mouth 2 (two) times daily.    Dispense:  60 tablet    Refill:  1  . omeprazole (PRILOSEC) 20 MG capsule    Sig: Take 1 capsule (20 mg total) by mouth daily.    Dispense:  30 capsule    Refill:  3   Referral Orders  No referral(s) requested today     Note is dictated utilizing voice recognition software. Although note has been proof read prior to signing, occasional typographical errors still can be missed. If any questions arise, please do not hesitate to call for verification.   electronically signed by:  Howard Pouch, DO  [place]

## 2019-03-11 ENCOUNTER — Encounter: Payer: Self-pay | Admitting: Family Medicine

## 2019-03-12 ENCOUNTER — Other Ambulatory Visit: Payer: Self-pay

## 2019-03-12 ENCOUNTER — Ambulatory Visit (INDEPENDENT_AMBULATORY_CARE_PROVIDER_SITE_OTHER): Payer: Managed Care, Other (non HMO) | Admitting: Family Medicine

## 2019-03-12 ENCOUNTER — Encounter: Payer: Self-pay | Admitting: Family Medicine

## 2019-03-12 VITALS — BP 113/71 | HR 72 | Temp 98.4°F | Resp 16 | Ht 64.1 in | Wt 159.4 lb

## 2019-03-12 DIAGNOSIS — Z0000 Encounter for general adult medical examination without abnormal findings: Secondary | ICD-10-CM | POA: Diagnosis not present

## 2019-03-12 DIAGNOSIS — E781 Pure hyperglyceridemia: Secondary | ICD-10-CM | POA: Diagnosis not present

## 2019-03-12 DIAGNOSIS — Z1231 Encounter for screening mammogram for malignant neoplasm of breast: Secondary | ICD-10-CM

## 2019-03-12 DIAGNOSIS — G8929 Other chronic pain: Secondary | ICD-10-CM

## 2019-03-12 DIAGNOSIS — G35 Multiple sclerosis: Secondary | ICD-10-CM

## 2019-03-12 DIAGNOSIS — Z131 Encounter for screening for diabetes mellitus: Secondary | ICD-10-CM

## 2019-03-12 DIAGNOSIS — M79675 Pain in left toe(s): Secondary | ICD-10-CM

## 2019-03-12 LAB — HEMOGLOBIN A1C: Hgb A1c MFr Bld: 6.2 % (ref 4.6–6.5)

## 2019-03-12 LAB — LIPID PANEL
Cholesterol: 184 mg/dL (ref 0–200)
HDL: 70.6 mg/dL (ref >39.00)
LDL Cholesterol: 93 mg/dL (ref 0–99)
NonHDL: 112.9
Total CHOL/HDL Ratio: 3
Triglycerides: 98 mg/dL (ref 0.0–149.0)
VLDL: 19.6 mg/dL (ref 0.0–40.0)

## 2019-03-12 LAB — TSH: TSH: 0.9 u[IU]/mL (ref 0.35–4.50)

## 2019-03-12 NOTE — Patient Instructions (Signed)
COVID-19 Vaccine Information can be found at: [email or web address] For questions related to vaccine distribution or appointments, please email [email or web address] or call [phone].  Covid Vaccine appointment go to [email or web address]  Uric acid lab appt on Monday.     Health Maintenance, Female Adopting a healthy lifestyle and getting preventive care are important in promoting health and wellness. Ask your health care provider about:  The right schedule for you to have regular tests and exams.  Things you can do on your own to prevent diseases and keep yourself healthy. What should I know about diet, weight, and exercise? Eat a healthy diet   Eat a diet that includes plenty of vegetables, fruits, low-fat dairy products, and lean protein.  Do not eat a lot of foods that are high in solid fats, added sugars, or sodium. Maintain a healthy weight Body mass index (BMI) is used to identify weight problems. It estimates body fat based on height and weight. Your health care provider can help determine your BMI and help you achieve or maintain a healthy weight. Get regular exercise Get regular exercise. This is one of the most important things you can do for your health. Most adults should:  Exercise for at least 150 minutes each week. The exercise should increase your heart rate and make you sweat (moderate-intensity exercise).  Do strengthening exercises at least twice a week. This is in addition to the moderate-intensity exercise.  Spend less time sitting. Even light physical activity can be beneficial. Watch cholesterol and blood lipids Have your blood tested for lipids and cholesterol at 49 years of age, then have this test every 5 years. Have your cholesterol levels checked more often if:  Your lipid or cholesterol levels are high.  You are older than 49 years of age.  You are at high risk for heart  disease. What should I know about cancer screening? Depending on your health history and family history, you may need to have cancer screening at various ages. This may include screening for:  Breast cancer.  Cervical cancer.  Colorectal cancer.  Skin cancer.  Lung cancer. What should I know about heart disease, diabetes, and high blood pressure? Blood pressure and heart disease  High blood pressure causes heart disease and increases the risk of stroke. This is more likely to develop in people who have high blood pressure readings, are of African descent, or are overweight.  Have your blood pressure checked: ? Every 3-5 years if you are 64-71 years of age. ? Every year if you are 29 years old or older. Diabetes Have regular diabetes screenings. This checks your fasting blood sugar level. Have the screening done:  Once every three years after age 41 if you are at a normal weight and have a low risk for diabetes.  More often and at a younger age if you are overweight or have a high risk for diabetes. What should I know about preventing infection? Hepatitis B If you have a higher risk for hepatitis B, you should be screened for this virus. Talk with your health care provider to find out if you are at risk for hepatitis B infection. Hepatitis C Testing is recommended for:  Everyone born from [birth year] through 1965.  Anyone with known risk factors for hepatitis C. Sexually transmitted infections (STIs)  Get screened for STIs, including gonorrhea and chlamydia, if: ? You are sexually active and are younger than 49 years of age. ? You are older than 49 years of age and your health care  provider tells you that you are at risk for this type of infection. ? Your sexual activity has changed since you were last screened, and you are at increased risk for chlamydia or gonorrhea. Ask your health care provider if you are at risk.  Ask your health care provider about whether you are at high  risk for HIV. Your health care provider may recommend a prescription medicine to help prevent HIV infection. If you choose to take medicine to prevent HIV, you should first get tested for HIV. You should then be tested every 3 months for as long as you are taking the medicine. Pregnancy  If you are about to stop having your period (premenopausal) and you may become pregnant, seek counseling before you get pregnant.  Take 400 to 800 micrograms (mcg) of folic acid every day if you become pregnant.  Ask for birth control (contraception) if you want to prevent pregnancy. Osteoporosis and menopause Osteoporosis is a disease in which the bones lose minerals and strength with aging. This can result in bone fractures. If you are 50 years old or older, or if you are at risk for osteoporosis and fractures, ask your health care provider if you should:  Be screened for bone loss.  Take a calcium or vitamin D supplement to lower your risk of fractures.  Be given hormone replacement therapy (HRT) to treat symptoms of menopause. Follow these instructions at home: Lifestyle  Do not use any products that contain nicotine or tobacco, such as cigarettes, e-cigarettes, and chewing tobacco. If you need help quitting, ask your health care provider.  Do not use street drugs.  Do not share needles.  Ask your health care provider for help if you need support or information about quitting drugs. Alcohol use  Do not drink alcohol if: ? Your health care provider tells you not to drink. ? You are pregnant, may be pregnant, or are planning to become pregnant.  If you drink alcohol: ? Limit how much you use to 0-1 drink a day. ? Limit intake if you are breastfeeding.  Be aware of how much alcohol is in your drink. In the [place] one drink equals one 12 oz bottle of beer (355 mL), one 5 oz glass of wine (148 mL), or one 1 oz glass of hard liquor (44 mL). General instructions  Schedule regular health, dental,  and eye exams.  Stay current with your vaccines.  Tell your health care provider if: ? You often feel depressed. ? You have ever been abused or do not feel safe at home. Summary  Adopting a healthy lifestyle and getting preventive care are important in promoting health and wellness.  Follow your health care provider's instructions about healthy diet, exercising, and getting tested or screened for diseases.  Follow your health care provider's instructions on monitoring your cholesterol and blood pressure. This information is not intended to replace advice given to you by your health care provider. Make sure you discuss any questions you have with your health care provider. Document Revised: 01/03/2018 Document Reviewed: 01/03/2018 Elsevier Patient Education  2020 [employer].

## 2019-03-12 NOTE — Progress Notes (Signed)
This visit occurred during the SARS-CoV-2 public health emergency.  Safety protocols were in place, including screening questions prior to the visit, additional usage of staff PPE, and extensive cleaning of exam room while observing appropriate contact time as indicated for disinfecting solutions.    Patient ID: Taylor Clements, female  DOB: 10-Nov-1970, 49 y.o.   MRN: [id number] Patient Care Team    Relationship Specialty Notifications Start End  Natalia Leatherwood, DO PCP - General Family Medicine  11/21/17     Chief Complaint  Patient presents with  . Annual Exam    Fasting. Pap smear 2019. Last mammogram 2019.     Subjective:  Taylor Clements is a 49 y.o.  Female  present for CPE. All past medical history, surgical history, allergies, family history, immunizations, medications and social history were updated in the electronic medical record today. All recent labs, ED visits and hospitalizations within the last year were reviewed.  Health maintenance:  Colonoscopy: No fhx; routine screen 45 Mammogram: completed: FHX mother, completed 12/2017, birads 1. @ [place]- [facility]> order placed Cervical cancer screening: last pap: 12/27/2017- Nl and neg co test- by Dr. Claiborne Billings. 5 yr follow up.  Immunizations: tdap 2015, Influenza completed UTD 2020 (encouraged yearly). Covid vaccine counseled. - She should speak to neuro about timing with her other injections.  Infectious disease screening: HIV completed DEXA: routine screen, no chronic steroid with MS  Assistive device: none Oxygen QJF:HLKT Patient has a Dental home. Hospitalizations/ED visits: reviewed  Depression screen [facility] 2/9 03/12/2019 12/27/2017 11/21/2017  Decreased Interest 0 0 0  Down, Depressed, Hopeless 0 0 0  PHQ - 2 Score 0 0 0  Altered sleeping - 0 -  Tired, decreased energy - 1 -  Change in appetite - 0 -  Feeling bad or failure about yourself  - 0 -  Trouble concentrating - 0 -  Moving slowly or fidgety/restless - 0 -    Suicidal thoughts - 0 -  PHQ-9 Score - 1 -  Difficult doing work/chores - Not difficult at all -   GAD 7 : Generalized Anxiety Score 12/27/2017  Nervous, Anxious, on Edge 0  Control/stop worrying 0  Worry too much - different things 0  Trouble relaxing 0  Restless 0  Easily annoyed or irritable 1  Afraid - awful might happen 0  Total GAD 7 Score 1  Anxiety Difficulty Not difficult at all    Immunization History  Administered Date(s) Administered  . Influenza,inj,Quad PF,6+ Mos 12/27/2017  . Influenza-Unspecified 10/25/2018   Past Medical History:  Diagnosis Date  . Actinic keratitis 12/07/2016   shaved, deep margins- pt declined further biospy, aldara and  Excel V laser completed by derm  . Allergy   . Chicken pox   . Idiopathic chronic venous hypertension of both legs with inflammation   . Menorrhagia    control on BCP  . Multiple sclerosis ([facility]) 2005   diagnosed 2005; after optic Neuritis. Has IBS, fatiue and right arm paresthesia w/ flares  . Optic neuritis due to multiple sclerosis ([facility]) 2007   loss vision; IV steroids --> vision returned.   . Phobia, flying    uses ativan with flights  . Post partum depression   . Venous insufficiency (chronic) (peripheral)    No Known Allergies Past Surgical History:  Procedure Laterality Date  . DILATION AND EVACUATION  2003   nonviable pregnancy  . image: duplex LLE Left 05/16/2016   scanned into media.   . Image:  MRI Brain  07/10/2017   Stable minor subcortical & periventricular white matter changes; MS  . LASER ABLATION Left 2014   left greater saphenous vein, then endoevous chemical ablation and sclerotherpay.   . procedure: Excel V laser  12/07/2016   Ak tip of nose shaved-deep margins-pt declined deeper bx, aldara cream and laser completed by derm.    Family History  Problem Relation Age of Onset  . Breast cancer Mother   . Asthma Brother   . Depression Brother   . Drug abuse Brother   . Learning disabilities  Brother   . Stroke Maternal Grandmother    Social History   Social History Narrative   Marital status/children/pets: married, 1 child   Safety:      -smoke alarm in the home:Yes     - wears seatbelt: Yes     - Feels safe in their relationships: Yes      Right handed    Caffeine use: tea daily    Allergies as of 03/12/2019   No Known Allergies     Medication List       Accurate as of March 12, 2019 11:59 PM. If you have any questions, ask your nurse or doctor.        Calcium 500-125 MG-UNIT Tabs Take 2 tablets by mouth daily.   famotidine 20 MG tablet Commonly known as: Pepcid Take 1 tablet (20 mg total) by mouth 2 (two) times daily.   Fish Oil 1200 MG Caps Take 1 capsule by mouth 2 (two) times daily.   hyoscyamine 0.125 MG SL tablet Commonly known as: LEVSIN SL Place 1 tablet (0.125 mg total) under the tongue every 4 (four) hours as needed.   LORazepam 0.5 MG tablet Commonly known as: ATIVAN Take 0.5 mg by mouth every 8 (eight) hours. 1-2 tabs prior to flight for anxiety   multivitamin capsule Take 1 capsule by mouth daily.   Olopatadine HCl 0.2 % Soln Apply 1 drop to eye daily.   omeprazole 20 MG capsule Commonly known as: PRILOSEC Take 1 capsule (20 mg total) by mouth daily.   Plegridy 125 MCG/0.5ML Sopn Generic drug: Peginterferon Beta-1a Inject 125 mcg into the skin every 14 (fourteen) days.       All past medical history, surgical history, allergies, family history, immunizations andmedications were updated in the EMR today and reviewed under the history and medication portions of their EMR.      ROS: 14 pt review of systems performed and negative (unless mentioned in an HPI)  Objective: BP 113/71 (BP Location: Right Arm, Patient Position: Sitting, Cuff Size: Normal)   Pulse 72   Temp 98.4 F (36.9 C) (Temporal)   Resp 16   Ht 5' 4.1" (1.628 m)   Wt 159 lb 6 oz (72.3 kg)   LMP 02/24/2019 (Exact Date)   SpO2 96%   BMI 27.27 kg/m    Gen: Afebrile. No acute distress. Nontoxic in appearance, well-developed, well-nourished, pleasant Caucasian female HENT: AT. [place]. Bilateral TM visualized and normal in appearance, normal external auditory canal. MMM, no oral lesions, adequate dentition. Bilateral nares within normal limits. Throat without erythema, ulcerations or exudates. No cough on exam, no hoarseness on exam. Eyes:Pupils Equal Round Reactive to light, Extraocular movements intact,  Conjunctiva without redness, discharge or icterus. Neck/lymp/endocrine: Supple, no lymphadenopathy, no thyromegaly CV: RRR no murmur, no edema, +2/4 P posterior tibialis pulses.  Chest: CTAB, no wheeze, rhonchi or crackles. Normal respiratory effort. Good air movement. Abd: Soft. Flat. NTND.  BS present. No masses palpated. No hepatosplenomegaly. No rebound tenderness or guarding. Skin: No rashes, purpura or petechiae. Warm and well-perfused. Skin intact. Neuro/Msk:  Normal gait. PERLA. EOMi. Alert. Oriented x3.  Cranial nerves II through XII intact. Muscle strength 5/5 upper/lower extremity. DTRs equal bilaterally. Psych: Normal affect, dress and demeanor. Normal speech. Normal thought content and judgment.   No exam data present  Assessment/plan: Taylor Clements is a 49 y.o. female present for CPE Diabetes mellitus screening - Hemoglobin A1c Hypertriglyceridemia - TSH - Lipid panel Encounter for screening mammogram for malignant neoplasm of breast - MM 3D SCREEN BREAST BILATERAL; Future Toe pain, chronic, left - reviewed labs with her in person from prior visit last week. See note. - Uric acid; Future MS (HCC) - managed by neurology. Current prescribed plegridy.  Encounter for preventive health examination Patient was encouraged to exercise greater than 150 minutes a week. Patient was encouraged to choose a diet filled with fresh fruits and vegetables, and lean meats. AVS provided to patient today for education/recommendation on  gender specific health and safety maintenance. Colonoscopy: No fhx; routine screen 45 Mammogram: completed: FHX mother, completed 12/2017, birads 1. @ [place] [facility]> order placed.  Cervical cancer screening: last pap: 12/27/2017- Nl and neg co test- by Dr. Claiborne Billings. 5 yr follow up.  Immunizations: tdap 2015, Influenza completed UTD 2020 (encouraged yearly). Covid vaccine counseled. - She should speak to neuro about timing with her other injections.  Infectious disease screening: HIV completed DEXA: routine screen, no chronic steroid with MS   Return in about 1 year (around 03/11/2020) for CPE (30 min).  Orders Placed This Encounter  Procedures  . MM 3D SCREEN BREAST BILATERAL  . TSH  . Lipid panel  . Hemoglobin A1c  . Uric acid   No orders of the defined types were placed in this encounter.  Referral Orders  No referral(s) requested today     Electronically signed by: Felix Pacini, DO [facility] [place]

## 2019-03-13 ENCOUNTER — Encounter: Payer: Self-pay | Admitting: Family Medicine

## 2019-03-18 ENCOUNTER — Other Ambulatory Visit: Payer: Self-pay

## 2019-03-18 ENCOUNTER — Ambulatory Visit: Payer: Managed Care, Other (non HMO)

## 2019-03-18 ENCOUNTER — Encounter: Payer: Self-pay | Admitting: Family Medicine

## 2019-03-18 NOTE — Telephone Encounter (Signed)
Pt was called and given information. She will call back to reschedule lab appt pending symptoms

## 2019-03-18 NOTE — Telephone Encounter (Signed)
Cancel the 1030 lab appointment today.  The order will remain open for when she gets symptoms she is to get labs collected that day.

## 2019-03-19 ENCOUNTER — Telehealth: Payer: Self-pay | Admitting: Neurology

## 2019-03-19 ENCOUNTER — Encounter: Payer: Self-pay | Admitting: Family Medicine

## 2019-03-19 NOTE — Telephone Encounter (Signed)
Called pt. She feels she may be having SE from Plegridy. She notices the following sx about 1 wk after her inj: light headedness, shaking, nausea. Scheduled f/u with Dr. Epimenio Foot for 03/25/19 at 1:30pm, check in 1:00pm. Instructed her to make sure to wear a mask, bring updated insurance cards and med list. She verbalized understanding.

## 2019-03-19 NOTE — Telephone Encounter (Signed)
Patient called requesting a sooner apt with MD but no sooner apt than  April available patient states she is having issues with her MS medication. Please follow up

## 2019-03-22 ENCOUNTER — Other Ambulatory Visit: Payer: Self-pay

## 2019-03-22 ENCOUNTER — Ambulatory Visit (INDEPENDENT_AMBULATORY_CARE_PROVIDER_SITE_OTHER): Payer: Managed Care, Other (non HMO)

## 2019-03-22 DIAGNOSIS — M79675 Pain in left toe(s): Secondary | ICD-10-CM

## 2019-03-22 DIAGNOSIS — G8929 Other chronic pain: Secondary | ICD-10-CM

## 2019-03-22 LAB — URIC ACID: Uric Acid, Serum: 4.5 mg/dL (ref 2.4–7.0)

## 2019-03-25 ENCOUNTER — Ambulatory Visit: Payer: Managed Care, Other (non HMO) | Admitting: Neurology

## 2019-03-25 ENCOUNTER — Encounter: Payer: Self-pay | Admitting: Neurology

## 2019-03-25 ENCOUNTER — Other Ambulatory Visit: Payer: Self-pay

## 2019-03-25 ENCOUNTER — Telehealth: Payer: Self-pay | Admitting: Neurology

## 2019-03-25 VITALS — BP 105/70 | HR 84 | Temp 97.5°F | Ht 64.1 in | Wt 156.5 lb

## 2019-03-25 DIAGNOSIS — R200 Anesthesia of skin: Secondary | ICD-10-CM

## 2019-03-25 DIAGNOSIS — G35 Multiple sclerosis: Secondary | ICD-10-CM | POA: Diagnosis not present

## 2019-03-25 DIAGNOSIS — Z79899 Other long term (current) drug therapy: Secondary | ICD-10-CM

## 2019-03-25 DIAGNOSIS — R42 Dizziness and giddiness: Secondary | ICD-10-CM | POA: Diagnosis not present

## 2019-03-25 DIAGNOSIS — K219 Gastro-esophageal reflux disease without esophagitis: Secondary | ICD-10-CM | POA: Diagnosis not present

## 2019-03-25 HISTORY — DX: Anesthesia of skin: R20.0

## 2019-03-25 NOTE — Progress Notes (Signed)
[facility]  PATIENT: Taylor Clements DOB: Oct 21, 1970  REFERRING DOCTOR OR PCP:  Howard Pouch SOURCE: patient, notes from Dr. Raoul Pitch,   _________________________________   HISTORICAL  CHIEF COMPLAINT:  She is a 49 y.o. woman with relapsing remitting multiple sclerosis.   Update 03/25/2019: Between Sunday and Thursday last week, e is experiencing episodes of lightheaded dizzy without vertigo.   A few were associated with shaking.   The longest was 2 hours and others about 1 hour.    Another episode without shaking was associated with extreme fatigue and lasted several .   None in the past 3-4 days.    She had no LOC.  BP was fine The evening before her first episode she had heartburn which is unusual for her.   She had swelling in the left foot the day of the first spell and has had a few other episodes.   She also had 2 episodes 2 weeks earlier over two days.    She has noted some symptoms seem to occur with her Plegridy injections.   She has been on Plegridy since 2015 and Avonex since 2005.    She has some skin reactions with each shot.       Her last MRI was 07/10/2017.    Update 09/27/2018: She feels her MS has been stable.   She is on Plegridy and generally tolerates it well with some mild skin reactions.      Gait and balance are doing well.   Strength and sensation are doing well.    She has some urgency and only a few episodes of incontinence in last decade.  Vision is doing well.     She is sometimes sleepy and tired.   She does not take naps but sometimes feels like she could.   No snoring.    She denies depression but has felt a little anxious at times with Covid.   She feels cognition is ok but sometimes has   She had some foot pain and saw podiatry.   She had an injection with benefit for plantar.    She was prescribed Voltaren 75 mg bid.      From 03/27/2018 initial consult: She is a 49 year old woman who was diagnosed with MS in late 2005.  She  first presented with right optic neuritis that year.   She was treated with several days of IV Solu-Medrol and vision improved.   An MRI was consistent with MS.   She was initially placed on Avonex late 2005 or early 2006.  Around 2014, after Plegridy was approved, she switched from Avonex to Plegridy.   She has no definite exacerbation.  However, about 5 years ago she began to notes spasticity in her right arm and more fatigue.     She was placed on baclofen but only took it about a month or less.    The spasticity improved.     She has not been on any treatment for her fatigue.   Currently, gait and balance are fine.   She could climb a ladder.    She denies any numbness or weakness in the limbs.    She denies any current bladder issues but has had some urinary urgency at times in the past and had a couple episodes of urge incontinence.  She has intermittent diarrhea and takes hyoscyamine prn.   This is worse with heat.    Vision returned to baseline initially.   More recently she notes  some blurry vision with reading.        She has fatigue daily.    She yawns some but does not doze off much.   She has never been on Provigil or other medication for this.       She sleeps well most nights getting abou 8 hours many nights.     She does not snore.    She denies depression.     She feels cognition is usually good but mild reduced focus and attention.       She has some pain in the bottom of the left foot, most likely to other her when she walks barefoot after sleeping or resting a while.   It improves after moving around or putting on shoes.    I personally reviewed the MRI of the brain performed in 2018.  It shows 6 or 7 small T2/flair hyperintense foci, 2 in the periventricular white matter and the rest in the subcortical and deep white matter.  None of the foci appear to be acute.  There was no atrophy.  There are no lesions in the infratentorial white matter.  None of the foci enhance.  She has no FH of  MS or other autoimmune disorder.        REVIEW OF SYSTEMS: Constitutional: No fevers, chills, sweats, or change in appetite.  She has fatigue.    Sleeps well Eyes: No visual changes, double vision, eye pain Ear, nose and throat: No hearing loss, ear pain, nasal congestion, sore throat Cardiovascular: No chest pain, palpitations Respiratory: No shortness of breath at rest or with exertion.   No wheezes GastrointestinaI: No nausea, vomiting, abdominal pain, fecal incontinence.  Occ diarrhea, maybe IBS.  Genitourinary: No dysuria,.  No nocturia.  Some urgency Musculoskeletal: No neck pain, back pain Integumentary: No rash, pruritus, skin lesions Neurological: as above Psychiatric: No depression at this time.  No anxiety Endocrine: No palpitations, diaphoresis, change in appetite, change in weigh or increased thirst Hematologic/Lymphatic: No anemia, purpura, petechiae. Allergic/Immunologic: No itchy/runny eyes, nasal congestion, recent allergic reactions, rashes  ALLERGIES: No Known Allergies  HOME MEDICATIONS:  Current Outpatient Medications:  .  Calcium 500-125 MG-UNIT TABS, Take 2 tablets by mouth daily., Disp: , Rfl:  .  famotidine (PEPCID) 20 MG tablet, Take 1 tablet (20 mg total) by mouth 2 (two) times daily., Disp: 60 tablet, Rfl: 1 .  hyoscyamine (LEVSIN SL) 0.125 MG SL tablet, Place 1 tablet (0.125 mg total) under the tongue every 4 (four) hours as needed., Disp: 90 tablet, Rfl: 1 .  LORazepam (ATIVAN) 0.5 MG tablet, Take 0.5 mg by mouth every 8 (eight) hours. 1-2 tabs prior to flight for anxiety, Disp: , Rfl:  .  Multiple Vitamin (MULTIVITAMIN) capsule, Take 1 capsule by mouth daily., Disp: , Rfl:  .  Olopatadine HCl 0.2 % SOLN, Apply 1 drop to eye daily., Disp: , Rfl:  .  Omega-3 Fatty Acids (FISH OIL) 1200 MG CAPS, Take 1 capsule by mouth 2 (two) times daily., Disp: , Rfl:  .  omeprazole (PRILOSEC) 20 MG capsule, Take 1 capsule (20 mg total) by mouth daily., Disp: 30  capsule, Rfl: 3 .  Peginterferon Beta-1a (PLEGRIDY) 125 MCG/0.5ML SOPN, Inject 125 mcg into the skin every 14 (fourteen) days., Disp: 6 pen, Rfl: 4  PAST MEDICAL HISTORY: Past Medical History:  Diagnosis Date  . Actinic keratitis 12/07/2016   shaved, deep margins- pt declined further biospy, aldara and  Excel V laser completed by derm  .  Allergy   . Chicken pox   . Idiopathic chronic venous hypertension of both legs with inflammation   . Menorrhagia    control on BCP  . Multiple sclerosis (HCC) 2005   diagnosed 2005; after optic Neuritis. Has IBS, fatiue and right arm paresthesia w/ flares  . Optic neuritis due to multiple sclerosis (HCC) 2007   loss vision; IV steroids --> vision returned.   . Phobia, flying    uses ativan with flights  . Post partum depression   . Venous insufficiency (chronic) (peripheral)     PAST SURGICAL HISTORY: Past Surgical History:  Procedure Laterality Date  . DILATION AND EVACUATION  2003   nonviable pregnancy  . image: duplex LLE Left 05/16/2016   scanned into media.   . Image: MRI Brain  07/10/2017   Stable minor subcortical & periventricular white matter changes; MS  . LASER ABLATION Left 2014   left greater saphenous vein, then endoevous chemical ablation and sclerotherpay.   . procedure: Excel V laser  12/07/2016   Ak tip of nose shaved-deep margins-pt declined deeper bx, aldara cream and laser completed by derm.     FAMILY HISTORY: Family History  Problem Relation Age of Onset  . Breast cancer Mother   . Asthma Brother   . Depression Brother   . Drug abuse Brother   . Learning disabilities Brother   . Stroke Maternal Grandmother     SOCIAL HISTORY:  Social History   Socioeconomic History  . Marital status: Married    Spouse name: Aurther Loft  . Number of children: 1  . Years of education: BA  . Highest education level: Not on file  Occupational History  . Occupation: n/a  Tobacco Use  . Smoking status: Former Games developer  .  Smokeless tobacco: Never Used  Substance and Sexual Activity  . Alcohol use: Not Currently  . Drug use: Never  . Sexual activity: Yes    Partners: Male  Other Topics Concern  . Not on file  Social History Narrative   Marital status/children/pets: married, 1 child   Safety:      -smoke alarm in the home:Yes     - wears seatbelt: Yes     - Feels safe in their relationships: Yes      Right handed    Caffeine use: tea daily   Social Determinants of Health   Financial Resource Strain:   . Difficulty of Paying Living Expenses: Not on file  Food Insecurity:   . Worried About Programme researcher, broadcasting/film/video in the Last Year: Not on file  . Ran Out of Food in the Last Year: Not on file  Transportation Needs:   . Lack of Transportation (Medical): Not on file  . Lack of Transportation (Non-Medical): Not on file  Physical Activity:   . Days of Exercise per Week: Not on file  . Minutes of Exercise per Session: Not on file  Stress:   . Feeling of Stress : Not on file  Social Connections:   . Frequency of Communication with Friends and Family: Not on file  . Frequency of Social Gatherings with Friends and Family: Not on file  . Attends Religious Services: Not on file  . Active Member of Clubs or Organizations: Not on file  . Attends Banker Meetings: Not on file  . Marital Status: Not on file  Intimate Partner Violence:   . Fear of Current or Ex-Partner: Not on file  . Emotionally Abused: Not on file  .  Physically Abused: Not on file  . Sexually Abused: Not on file     PHYSICAL EXAM (from 03/27/2018 initial consult)  Vitals:   03/25/19 1317  BP: 105/70  Pulse: 84  Temp: (!) 97.5 F (36.4 C)  Weight: 156 lb 8 oz (71 kg)  Height: 5' 4.1" (1.628 m)    Body mass index is 26.78 kg/m.   General: The patient is well-developed and well-nourished and in no acute distress  Skin: Extremities are without significant edema.  Neurologic Exam  Mental status: The patient is  alert and oriented x 3 at the time of the examination. The patient has apparent normal recent and remote memory, with an apparently normal attention span and concentration ability.   Speech is normal.  Cranial nerves: Extraocular movements are full.. There is good facial sensation to soft touch bilaterally.Facial strength is normal.  Trapezius and sternocleidomastoid strength is normal. No dysarthria is noted. No obvious hearing deficits are noted.  Motor:  Muscle bulk is normal.   Tone is normal. Strength is  5 / 5 in all 4 extremities.   Sensory: Sensory testing is intact to pinprick, soft touch and vibration sensation in all 4 extremities.  Coordination: Cerebellar testing reveals good finger-nose-finger and heel-to-shin bilaterally.  Gait and station: Station is normal.    Gait and tandem gait are normal.. Romberg is negative.   Reflexes: Deep tendon reflexes are symmetric and normal bilaterally.     Multiple sclerosis (HCC) - Plan: MR BRAIN W WO CONTRAST  High risk medication use  Lightheaded  Gastroesophageal reflux disease, unspecified whether esophagitis present  Numbness    1.   Continue Plegridy.   Check MRI to determine if any subclinical progression and consider a change in DMT if this is occurring. 2.   If spells of lightheadedness persist, consider a Holter monitor   Theone Bowell A. Epimenio Foot, MD, [facility] 03/25/2019, 4:26 PM Certified in Neurology, Clinical Neurophysiology, Sleep Medicine, Pain Medicine and Neuroimaging  [facility] [address], [address] [place], [place] [address] [phone]

## 2019-03-25 NOTE — Telephone Encounter (Signed)
[employer] order sent to [facility]. They will obtain the auth and reach out to the patient to schedule.  

## 2019-03-27 ENCOUNTER — Encounter: Payer: Self-pay | Admitting: Family Medicine

## 2019-04-02 ENCOUNTER — Ambulatory Visit (HOSPITAL_BASED_OUTPATIENT_CLINIC_OR_DEPARTMENT_OTHER)
Admission: RE | Admit: 2019-04-02 | Discharge: 2019-04-02 | Disposition: A | Discharge status: Home / Self Care | Payer: Managed Care, Other (non HMO) | Source: Ambulatory Visit | Attending: Family Medicine | Admitting: Family Medicine

## 2019-04-02 ENCOUNTER — Encounter: Payer: Self-pay | Admitting: Family Medicine

## 2019-04-02 ENCOUNTER — Ambulatory Visit (INDEPENDENT_AMBULATORY_CARE_PROVIDER_SITE_OTHER): Payer: Managed Care, Other (non HMO) | Admitting: Family Medicine

## 2019-04-02 ENCOUNTER — Other Ambulatory Visit: Payer: Self-pay

## 2019-04-02 VITALS — BP 132/84 | HR 80 | Temp 98.3°F | Resp 17 | Ht 64.1 in | Wt 153.1 lb

## 2019-04-02 DIAGNOSIS — M79673 Pain in unspecified foot: Secondary | ICD-10-CM

## 2019-04-02 DIAGNOSIS — M79675 Pain in left toe(s): Secondary | ICD-10-CM | POA: Insufficient documentation

## 2019-04-02 DIAGNOSIS — G8929 Other chronic pain: Secondary | ICD-10-CM | POA: Diagnosis present

## 2019-04-02 LAB — URIC ACID: Uric Acid, Serum: 4.3 mg/dL (ref 2.4–7.0)

## 2019-04-02 NOTE — Patient Instructions (Signed)
Try taking naproxen from Day 4 -6 post shot to prevent toe pain.  I will call you with xray and uric acid levels.

## 2019-04-02 NOTE — Telephone Encounter (Signed)
Pt was called and scheduled for 1130 today, in office

## 2019-04-02 NOTE — Telephone Encounter (Signed)
Please advise 

## 2019-04-02 NOTE — Telephone Encounter (Signed)
Please have her schedule a in person visit today if possible.

## 2019-04-02 NOTE — Progress Notes (Signed)
This visit occurred during the SARS-CoV-2 public health emergency.  Safety protocols were in place, including screening questions prior to the visit, additional usage of staff PPE, and extensive cleaning of exam room while observing appropriate contact time as indicated for disinfecting solutions.    Taylor Clements , 02-23-1970, 49 y.o., female MRN: [id number] Patient Care Team    Relationship Specialty Notifications Start End  Ma Hillock, DO PCP - General Family Medicine  11/21/17     Chief Complaint  Patient presents with  . Foot Pain    Left foot pain that started yesterday. Pt had shot, Plegridy, Wednesday      Subjective: Taylor Clements is a 49 y.o. female present for recurrent left toe/foot pain a few days after her Plegridy injection.  Patient presents again for left toe pain that started 5 days after her Plegridy shot.  She endorses it had awakened her at 4 AM and caused excruciating pain.  She did take a naproxen this morning.  She endorses mild swelling, mild erythema with painful joints of left large toe with weightbearing and nonweightbearing.  She denies fever or chills.  Prior note:  Pt presents for an OV with multiple complaints, which she is not certain if they are related.  Patient states she has noticed she gets nausea, dizzy, shaky and lightheaded about 30 minutes to 1 hour after her Plegridy injection.  She states also she has noticed the last few injections -4-5 days after she gives herself an injection she has pain in her large left toe.  There can be some redness and swelling associated with this discomfort. She had spoke with her neurologist about the above symptoms and they had encouraged her to start indomethacin.  She states she took indomethacin once only.  She did feel that her symptoms resolved quicker after taking the indomethacin.  She also reports routinely using ibuprofen a few days prior and after her injections in preparation of side  effects.  She had not started the Pepcid also encouraged by neurology.  She does not have a history of polyarthralgia or gout.  Depression screen [facility] 2/9 03/12/2019 12/27/2017 11/21/2017  Decreased Interest 0 0 0  Down, Depressed, Hopeless 0 0 0  PHQ - 2 Score 0 0 0  Altered sleeping - 0 -  Tired, decreased energy - 1 -  Change in appetite - 0 -  Feeling bad or failure about yourself  - 0 -  Trouble concentrating - 0 -  Moving slowly or fidgety/restless - 0 -  Suicidal thoughts - 0 -  PHQ-9 Score - 1 -  Difficult doing work/chores - Not difficult at all -    No Known Allergies Social History   Social History Narrative   Marital status/children/pets: married, 1 child   Safety:      -smoke alarm in the home:Yes     - wears seatbelt: Yes     - Feels safe in their relationships: Yes      Right handed    Caffeine use: tea daily   Past Medical History:  Diagnosis Date  . Actinic keratitis 12/07/2016   shaved, deep margins- pt declined further biospy, aldara and  Excel V laser completed by derm  . Allergy   . Chicken pox   . Idiopathic chronic venous hypertension of both legs with inflammation   . Menorrhagia    control on BCP  . Multiple sclerosis ([place]) 2005   diagnosed 2005; after optic Neuritis.  Has IBS, fatiue and right arm paresthesia w/ flares  . Optic neuritis due to multiple sclerosis (HCC) 2007   loss vision; IV steroids --> vision returned.   . Phobia, flying    uses ativan with flights  . Post partum depression   . Venous insufficiency (chronic) (peripheral)    Past Surgical History:  Procedure Laterality Date  . DILATION AND EVACUATION  2003   nonviable pregnancy  . image: duplex LLE Left 05/16/2016   scanned into media.   . Image: MRI Brain  07/10/2017   Stable minor subcortical & periventricular white matter changes; MS  . LASER ABLATION Left 2014   left greater saphenous vein, then endoevous chemical ablation and sclerotherpay.   . procedure: Excel V  laser  12/07/2016   Ak tip of nose shaved-deep margins-pt declined deeper bx, aldara cream and laser completed by derm.    Family History  Problem Relation Age of Onset  . Breast cancer Mother   . Asthma Brother   . Depression Brother   . Drug abuse Brother   . Learning disabilities Brother   . Stroke Maternal Grandmother    Allergies as of 04/02/2019   No Known Allergies     Medication List       Accurate as of April 02, 2019 12:13 PM. If you have any questions, ask your nurse or doctor.        Calcium 500-125 MG-UNIT Tabs Take 2 tablets by mouth daily.   famotidine 20 MG tablet Commonly known as: Pepcid Take 1 tablet (20 mg total) by mouth 2 (two) times daily.   Fish Oil 1200 MG Caps Take 1 capsule by mouth 2 (two) times daily.   hyoscyamine 0.125 MG SL tablet Commonly known as: LEVSIN SL Place 1 tablet (0.125 mg total) under the tongue every 4 (four) hours as needed.   LORazepam 0.5 MG tablet Commonly known as: ATIVAN Take 0.5 mg by mouth every 8 (eight) hours. 1-2 tabs prior to flight for anxiety   multivitamin capsule Take 1 capsule by mouth daily.   Olopatadine HCl 0.2 % Soln Apply 1 drop to eye daily.   omeprazole 20 MG capsule Commonly known as: PRILOSEC Take 1 capsule (20 mg total) by mouth daily.   Plegridy 125 MCG/0.5ML Sopn Generic drug: Peginterferon Beta-1a Inject 125 mcg into the skin every 14 (fourteen) days.       All past medical history, surgical history, allergies, family history, immunizations andmedications were updated in the EMR today and reviewed under the history and medication portions of their EMR.     ROS: Negative, with the exception of above mentioned in HPI   Objective:  BP 132/84 (BP Location: Right Arm, Patient Position: Sitting, Cuff Size: Normal)   Pulse 80   Temp 98.3 F (36.8 C) (Temporal)   Resp 17   Ht 5' 4.1" (1.628 m)   Wt 153 lb 2 oz (69.5 kg)   LMP 03/20/2019 (Exact Date)   SpO2 100%   BMI 26.20 kg/m   Body mass index is 26.2 kg/m. Gen: Afebrile. No acute distress.  Nontoxic in presentation pleasant Caucasian female HENT: AT. [place]. MSK: Very mild erythema of left first proximal phalanx joint-  Mild swelling at this location.  Tender to palpation left first proximal and distal phalanx joints.  Mildly decreased range of motion secondary to pain.  Neurovascularly intact distally. Skin: no rashes, purpura or petechiae.  Neuro: Normal gait. PERLA. EOMi. Alert. Oriented x3   No exam data  present No results found. No results found for this or any previous visit (from the past 24 hour(s)).  Assessment/Plan: Taylor Clements is a 49 y.o. female present for OV for  Toe pain, chronic, left - Her symptom onset has established a pattern that this occurs approximately the fifth day after each Plegridy injection.  Although we might not be able to identify the actual reaction or side effect, this is most certainly related in some format to her Plegridy injections. -  Plegridy does have a side effect of increasing body temperature which could be causing an  erythromelalgia reaction.   - Ordered x-ray of left large toe- ? arthritis flares from her Plegridy? - Uric acid level collected again today.  If normal this rules out gout as a possible cause.  If elevated could consider allopurinol daily versus colchicine as needed. -If uric acid levels are normal, encouraged her to start naproxen with food on day 4 through 6 after her injection in an attempt to prevent flare. - f/u dependent upon results.    Reviewed expectations re: course of current medical issues.  Discussed self-management of symptoms.  Outlined signs and symptoms indicating need for more acute intervention.  Patient verbalized understanding and all questions were answered.  Patient received an After-Visit Summary.    Orders Placed This Encounter  Procedures  . DG Toe Great Left  . Uric acid   No orders of the defined types were  placed in this encounter.  Referral Orders  No referral(s) requested today     Note is dictated utilizing voice recognition software. Although note has been proof read prior to signing, occasional typographical errors still can be missed. If any questions arise, please do not hesitate to call for verification.   electronically signed by:  Felix Pacini, DO  [facility] - [place]

## 2019-04-03 ENCOUNTER — Ambulatory Visit: Payer: Managed Care, Other (non HMO) | Admitting: Family Medicine

## 2019-04-03 ENCOUNTER — Telehealth: Payer: Self-pay | Admitting: Family Medicine

## 2019-04-03 MED ORDER — COLCHICINE 0.6 MG PO TABS: ORAL_TABLET | ORAL | 1 refills | Status: DC

## 2019-04-03 NOTE — Telephone Encounter (Signed)
Please inform patient the following information: - Xray is read as normal and no joint degeneration noted.  Therefore no obvious arthritis. -Uric acid levels again normal, actually , even lower than  Prior. Therefore this is  not gout-but there is a condition called pseudogout in which uric acid would be normal.  Pseudogout flares have been noted to occur after certain types of injections (not Plegridy specifically).  Pseudogout flares usually can be prevented from occurring if taking NSAIDs or colchicine as we discussed during her visit.   I recommend she take naproxen with food on days 4 - 6 after her next Plegridy injection.  This will hopefully prevent the inflammatory reaction that is occurring after her injections.  If the above recommendations are not effective or upset her stomach, then try taking the medication I prescribed and sent to her pharmacy this morning called colchicine.  If needing to try the colchicine-I would recommend taking 1 tab a day on day 3-6 after Plegridy injection.    Follow-up in 4 weeks.  Ideally after at least 2 more rounds of injections so we can compare results to the preventative measures recommended above.

## 2019-04-03 NOTE — Telephone Encounter (Signed)
Pt was called and given all information, she verbalized understanding. She will have to look at her calender and call back to schedule F/U

## 2019-04-04 ENCOUNTER — Other Ambulatory Visit: Payer: Self-pay | Admitting: Family Medicine

## 2019-04-04 DIAGNOSIS — Z1231 Encounter for screening mammogram for malignant neoplasm of breast: Secondary | ICD-10-CM

## 2019-04-10 ENCOUNTER — Ambulatory Visit
Admission: RE | Admit: 2019-04-10 | Discharge: 2019-04-10 | Disposition: A | Discharge status: Home / Self Care | Payer: Managed Care, Other (non HMO) | Source: Ambulatory Visit | Attending: Family Medicine | Admitting: Family Medicine

## 2019-04-10 ENCOUNTER — Other Ambulatory Visit: Payer: Self-pay

## 2019-04-10 DIAGNOSIS — Z1231 Encounter for screening mammogram for malignant neoplasm of breast: Secondary | ICD-10-CM

## 2019-04-15 NOTE — Telephone Encounter (Signed)
Rutherford Nail: [id number] (exp. 04/12/19 to 10/09/19) patient is scheduled at GI for 04/17/19.

## 2019-04-17 ENCOUNTER — Ambulatory Visit
Admission: RE | Admit: 2019-04-17 | Discharge: 2019-04-17 | Disposition: A | Discharge status: Home / Self Care | Payer: Managed Care, Other (non HMO) | Source: Ambulatory Visit | Attending: Neurology | Admitting: Neurology

## 2019-04-17 ENCOUNTER — Other Ambulatory Visit: Payer: Self-pay

## 2019-04-17 DIAGNOSIS — G35 Multiple sclerosis: Secondary | ICD-10-CM

## 2019-04-17 MED ORDER — GADOBENATE DIMEGLUMINE 529 MG/ML IV SOLN
14.0000 mL | Freq: Once | INTRAVENOUS | Status: AC | PRN
  Administered 2019-04-17: 14 mL via INTRAVENOUS

## 2019-05-02 ENCOUNTER — Other Ambulatory Visit: Payer: Self-pay

## 2019-05-02 ENCOUNTER — Encounter: Payer: Self-pay | Admitting: Family Medicine

## 2019-05-02 ENCOUNTER — Ambulatory Visit: Payer: Managed Care, Other (non HMO) | Admitting: Family Medicine

## 2019-05-02 DIAGNOSIS — R110 Nausea: Secondary | ICD-10-CM | POA: Diagnosis not present

## 2019-05-02 DIAGNOSIS — R42 Dizziness and giddiness: Secondary | ICD-10-CM

## 2019-05-02 MED ORDER — OMEPRAZOLE 20 MG PO CPDR: 20.0000 mg | DELAYED_RELEASE_CAPSULE | Freq: Every day | ORAL | 3 refills | Status: DC

## 2019-05-02 NOTE — Progress Notes (Signed)
This visit occurred during the SARS-CoV-2 public health emergency.  Safety protocols were in place, including screening questions prior to the visit, additional usage of staff PPE, and extensive cleaning of exam room while observing appropriate contact time as indicated for disinfecting solutions.    Taylor Clements , [birth year], 49 y.o., female MRN: [id number] Patient Care Team    Relationship Specialty Notifications Start End  Natalia Leatherwood, DO PCP - General Family Medicine  11/21/17     Chief Complaint  Patient presents with  . Left foot pain     Subjective: Taylor Clements is a 49 y.o. female present for recurrent left toe/foot pain a few days after her Plegridy injection.  - Xray is read as normal and no joint degeneration noted.  Therefore no obvious arthritis. Uric acid levels have remained normal even during flare, therefore gout has been ruled out as possible cause.  She has tried the naproxen on day 4-8 after her last Plegridy injection and she endorses there was no recurrence of her foot pain.  She does endorse  the other side effects of rather significant dizziness remained.  She discussed with her neurologist and they instructed her to hold her next dose of Plegridy in which she did.  She states she has had no foot discomfort and all the other side effects has resolved.  She states she has had multiple "really good days "since skipping the dose.  Prior note: Patient presents again for left toe pain that started 5 days after her Plegridy shot.  She endorses it had awakened her at 4 AM and caused excruciating pain.  She did take a naproxen this morning.  She endorses mild swelling, mild erythema with painful joints of left large toe with weightbearing and nonweightbearing.  She denies fever or chills.  Prior note:  Pt presents for an OV with multiple complaints, which she is not certain if they are related.  Patient states she has noticed she gets nausea, dizzy,  shaky and lightheaded about 30 minutes to 1 hour after her Plegridy injection.  She states also she has noticed the last few injections -4-5 days after she gives herself an injection she has pain in her large left toe.  There can be some redness and swelling associated with this discomfort. She had spoke with her neurologist about the above symptoms and they had encouraged her to start indomethacin.  She states she took indomethacin once only.  She did feel that her symptoms resolved quicker after taking the indomethacin.  She also reports routinely using ibuprofen a few days prior and after her injections in preparation of side effects.  She had not started the Pepcid also encouraged by neurology.  She does not have a history of polyarthralgia or gout.  Depression screen [facility] 2/9 03/12/2019 12/27/2017 11/21/2017  Decreased Interest 0 0 0  Down, Depressed, Hopeless 0 0 0  PHQ - 2 Score 0 0 0  Altered sleeping - 0 -  Tired, decreased energy - 1 -  Change in appetite - 0 -  Feeling bad or failure about yourself  - 0 -  Trouble concentrating - 0 -  Moving slowly or fidgety/restless - 0 -  Suicidal thoughts - 0 -  PHQ-9 Score - 1 -  Difficult doing work/chores - Not difficult at all -    No Known Allergies Social History   Social History Narrative   Marital status/children/pets: married, 1 child   Safety:      -  smoke alarm in the home:Yes     - wears seatbelt: Yes     - Feels safe in their relationships: Yes      Right handed    Caffeine use: tea daily   Past Medical History:  Diagnosis Date  . Actinic keratitis 12/07/2016   shaved, deep margins- pt declined further biospy, aldara and  Excel V laser completed by derm  . Allergy   . Chicken pox   . Idiopathic chronic venous hypertension of both legs with inflammation   . Menorrhagia    control on BCP  . Multiple sclerosis ([place]) 2005   diagnosed 2005; after optic Neuritis. Has IBS, fatiue and right arm paresthesia w/ flares  . 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   . Phobia, flying    uses ativan with flights  . Post partum depression   . Venous insufficiency (chronic) (peripheral)    Past Surgical History:  Procedure Laterality Date  . DILATION AND EVACUATION  2003   nonviable pregnancy  . image: duplex LLE Left 05/16/2016   scanned into media.   . Image: MRI Brain  07/10/2017   Stable minor subcortical & periventricular white matter changes; MS  . LASER ABLATION Left 2014   left greater saphenous vein, then endoevous chemical ablation and sclerotherpay.   . procedure: Excel V laser  12/07/2016   Ak tip of nose shaved-deep margins-pt declined deeper bx, aldara cream and laser completed by derm.    Family History  Problem Relation Age of Onset  . Breast cancer Mother   . Asthma Brother   . Depression Brother   . Drug abuse Brother   . Learning disabilities Brother   . Stroke Maternal Grandmother    Allergies as of 05/02/2019   No Known Allergies     Medication List       Accurate as of May 02, 2019  1:17 PM. If you have any questions, ask your nurse or doctor.        STOP taking these medications   famotidine 20 MG tablet Commonly known as: Pepcid Stopped by: Howard Pouch, DO     TAKE these medications   Calcium 500-125 MG-UNIT Tabs Take 2 tablets by mouth daily.   colchicine 0.6 MG tablet Take 1 tab daily on day 3 -6 after Plegridy injection   Fish Oil 1200 MG Caps Take 1 capsule by mouth 2 (two) times daily.   hyoscyamine 0.125 MG SL tablet Commonly known as: LEVSIN SL Place 1 tablet (0.125 mg total) under the tongue every 4 (four) hours as needed.   LORazepam 0.5 MG tablet Commonly known as: ATIVAN Take 0.5 mg by mouth every 8 (eight) hours. 1-2 tabs prior to flight for anxiety   multivitamin capsule Take 1 capsule by mouth daily.   Olopatadine HCl 0.2 % Soln Apply 1 drop to eye daily.   omeprazole 20 MG capsule  Commonly known as: PRILOSEC Take 1 capsule (20 mg total) by mouth daily.   Plegridy 125 MCG/0.5ML Sopn Generic drug: Peginterferon Beta-1a Inject 125 mcg into the skin every 14 (fourteen) days.       All past medical history, surgical history, allergies, family history, immunizations andmedications were updated in the EMR today and reviewed under the history and medication portions of their EMR.     ROS: Negative, with the exception of above mentioned in HPI   Objective:  BP 106/76 (BP Location: Right  Arm, Patient Position: Sitting, Cuff Size: Normal)   Pulse 94   Temp 98.2 F (36.8 C) (Temporal)   Ht 5' 4.1" (1.628 m)   Wt 151 lb 12.8 oz (68.9 kg)   LMP 04/17/2019 (Exact Date)   SpO2 100%   BMI 25.98 kg/m  Body mass index is 25.98 kg/m. Gen: Afebrile. No acute distress.  HENT: AT. [place].  Eyes:Pupils Equal Round Reactive to light, Extraocular movements intact,  Conjunctiva without redness, discharge or icterus. MSK: Bilateral feet without erythema, swelling or tenderness. Skin: no rashes, purpura or petechiae.  Neuro:  Normal gait. PERLA. EOMi. Alert. Oriented x3 Psych: Normal affect, dress and demeanor. Normal speech. Normal thought content and judgment..    No exam data present No results found. No results found for this or any previous visit (from the past 24 hour(s)).  Assessment/Plan: Taylor Clements is a 49 y.o. female present for OV for  Toe pain, chronic, left -Pain has resolved with use of naproxen on day 4-8 after her Plegridy injection.  Discussed likely pseudogout-like reaction, which is consistent with her symptoms, especially since there is resolution with scheduled NSAIDs. -Since her other complaints of dizziness and side effects remained after injections but resolved when injection was skipped, I believe she needs to discuss with her neurologist other options for treatment of her MS if she finds the side effects intolerable. Continue naproxen on days  4-8 if continuing Plegridy. Continue omeprazole the week of her Plegridy, if continuing naproxen. Follow-up as needed   Reviewed expectations re: course of current medical issues.  Discussed self-management of symptoms.  Outlined signs and symptoms indicating need for more acute intervention.  Patient verbalized understanding and all questions were answered.  Patient received an After-Visit Summary.    No orders of the defined types were placed in this encounter.  Meds ordered this encounter  Medications  . omeprazole (PRILOSEC) 20 MG capsule    Sig: Take 1 capsule (20 mg total) by mouth daily.    Dispense:  30 capsule    Refill:  3   Referral Orders  No referral(s) requested today     Note is dictated utilizing voice recognition software. Although note has been proof read prior to signing, occasional typographical errors still can be missed. If any questions arise, please do not hesitate to call for verification.   electronically signed by:  Felix Pacini, DO  [facility] [place]

## 2019-05-02 NOTE — Patient Instructions (Signed)
Great to see you today.  Keep [place] informed on any changes to your medications.

## 2019-05-03 ENCOUNTER — Ambulatory Visit: Payer: Self-pay | Admitting: Family Medicine

## 2019-05-15 ENCOUNTER — Other Ambulatory Visit: Payer: Self-pay

## 2019-05-15 ENCOUNTER — Ambulatory Visit: Payer: Managed Care, Other (non HMO) | Admitting: Neurology

## 2019-05-15 ENCOUNTER — Encounter: Payer: Self-pay | Admitting: Neurology

## 2019-05-15 VITALS — BP 120/72 | HR 79 | Temp 97.2°F | Ht 64.1 in | Wt 154.5 lb

## 2019-05-15 DIAGNOSIS — Z79899 Other long term (current) drug therapy: Secondary | ICD-10-CM

## 2019-05-15 DIAGNOSIS — R200 Anesthesia of skin: Secondary | ICD-10-CM | POA: Diagnosis not present

## 2019-05-15 DIAGNOSIS — Z8669 Personal history of other diseases of the nervous system and sense organs: Secondary | ICD-10-CM | POA: Diagnosis not present

## 2019-05-15 DIAGNOSIS — G35 Multiple sclerosis: Secondary | ICD-10-CM

## 2019-05-15 DIAGNOSIS — R42 Dizziness and giddiness: Secondary | ICD-10-CM

## 2019-05-15 NOTE — Progress Notes (Signed)
[facility]  PATIENT: Taylor Clements DOB: 07-21-1970  REFERRING DOCTOR OR PCP:  Taylor Clements SOURCE: patient, notes from Dr. Claiborne Billings,   _________________________________   HISTORICAL  CHIEF COMPLAINT:  She is a 49 y.o. woman with relapsing remitting multiple sclerosis.  Update 05/15/2019: She has noted tinnitus, R > L,  since the second Pfizer vaccine 05/04/19.    She feels a tight stuffy sensation in her ears, R=L.   If she moves, especially looking down, she will feel dizzy/vertigo.  She denies lightheadedness.   After the shot she had ear ache for 7-10 days and headache for a few days.    She has some trouble with sleeping but with a humidifier on, the noise distracts the tinnitus.    She feels gait is doing about the same.  No major problem with weakness or numbness.  Bladder function is doing well.  She has a history of optic neuritis but feels her vision is doing good.  Patient notes some fatigue.  She was on Plegridy but stopped 04/10/2019.   She had problems with swelling in her feet a few days after each shot.   This was very painful.  She was diagnosed with pseudogout.  She also also had nausea or brain fog sensation.  We went over the risks and benefits of different treatment.   She was diagnosed with MS in 2005 after ON and she may also have had ON in 1998 (just lasted a couple days).  Her last MRI 04/17/19 showed scattered T2/FLAIR hyperintense foci in the periventricular, juxtacortical and deep white matter of the hemispheres in a pattern and configuration compatible with chronic demyelinating plaque associated with multiple sclerosis.  None the foci appear to be acute.  They do not enhance.  Compared to the MRI dated 07/10/2017, there are no new lesions.   Update 03/25/2019: Between Sunday and Thursday last week, e is experiencing episodes of lightheaded dizzy without vertigo.   A few were associated with shaking.   The longest was 2 hours and others about 1  hour.    Another episode without shaking was associated with extreme fatigue and lasted several .   None in the past 3-4 days.    She had no LOC.  BP was fine The evening before her first episode she had heartburn which is unusual for her.   She had swelling in the left foot the day of the first spell and has had a few other episodes.   She also had 2 episodes 2 weeks earlier over two days.    She has noted some symptoms seem to occur with her Plegridy injections.   She has been on Plegridy since 2015 and Avonex since 2005.    She has some skin reactions with each shot.       Her last MRI was 07/10/2017 and showed scattered T2/FLAIR hyperintense foci in the periventricular, juxtacortical and deep white matter of the hemispheres in a pattern and configuration compatible with chronic demyelinating plaque associated with multiple sclerosis.  None the foci appear to be acute.  They do not enhance.  Compared to the MRI dated 07/10/2017, there are no new lesions.    There is a normal enhancement pattern and no acute findings.  Update 09/27/2018: She feels her MS has been stable.   She is on Plegridy and generally tolerates it well with some mild skin reactions.      Gait and balance are doing well.   Strength and  sensation are doing well.    She has some urgency and only a few episodes of incontinence in last decade.  Vision is doing well.     She is sometimes sleepy and tired.   She does not take naps but sometimes feels like she could.   No snoring.    She denies depression but has felt a little anxious at times with Covid.   She feels cognition is ok but sometimes has   She had some foot pain and saw podiatry.   She had an injection with benefit for plantar.    She was prescribed Voltaren 75 mg bid.      From 03/27/2018 initial consult: She is a 49 year old woman who was diagnosed with MS in late 2005.  She first presented with right optic neuritis that year.   She was treated with several days of IV  Solu-Medrol and vision improved.   An MRI was consistent with MS.   She was initially placed on Avonex late 2005 or early 2006.  Around 2014, after Plegridy was approved, she switched from Avonex to Plegridy.   She has no definite exacerbation.  However, about 5 years ago she began to notes spasticity in her right arm and more fatigue.     She was placed on baclofen but only took it about a month or less.    The spasticity improved.     She has not been on any treatment for her fatigue.   Currently, gait and balance are fine.   She could climb a ladder.    She denies any numbness or weakness in the limbs.    She denies any current bladder issues but has had some urinary urgency at times in the past and had a couple episodes of urge incontinence.  She has intermittent diarrhea and takes hyoscyamine prn.   This is worse with heat.    Vision returned to baseline initially.   More recently she notes some blurry vision with reading.        She has fatigue daily.    She yawns some but does not doze off much.   She has never been on Provigil or other medication for this.       She sleeps well most nights getting abou 8 hours many nights.     She does not snore.    She denies depression.     She feels cognition is usually good but mild reduced focus and attention.       She has some pain in the bottom of the left foot, most likely to other her when she walks barefoot after sleeping or resting a while.   It improves after moving around or putting on shoes.    I personally reviewed the MRI of the brain performed in 2018.  It shows 6 or 7 small T2/flair hyperintense foci, 2 in the periventricular white matter and the rest in the subcortical and deep white matter.  None of the foci appear to be acute.  There was no atrophy.  There are no lesions in the infratentorial white matter.  None of the foci enhance.  She has no FH of MS or other autoimmune disorder.        REVIEW OF SYSTEMS: Constitutional: No fevers,  chills, sweats, or change in appetite.  She has fatigue.    Sleeps well Eyes: No visual changes, double vision, eye pain Ear, nose and throat: No hearing loss, ear pain, nasal congestion, sore throat  Cardiovascular: No chest pain, palpitations Respiratory: No shortness of breath at rest or with exertion.   No wheezes GastrointestinaI: No nausea, vomiting, abdominal pain, fecal incontinence.  Occ diarrhea, maybe IBS.  Genitourinary: No dysuria,.  No nocturia.  Some urgency Musculoskeletal: No neck pain, back pain Integumentary: No rash, pruritus, skin lesions Neurological: as above Psychiatric: No depression at this time.  No anxiety Endocrine: No palpitations, diaphoresis, change in appetite, change in weigh or increased thirst Hematologic/Lymphatic: No anemia, purpura, petechiae. Allergic/Immunologic: No itchy/runny eyes, nasal congestion, recent allergic reactions, rashes  ALLERGIES: No Known Allergies  HOME MEDICATIONS:  Current Outpatient Medications:  .  Calcium 500-125 MG-UNIT TABS, Take 2 tablets by mouth daily., Disp: , Rfl:  .  colchicine 0.6 MG tablet, Take 1 tab daily on day 3 -6 after Plegridy injection, Disp: 10 tablet, Rfl: 1 .  hyoscyamine (LEVSIN SL) 0.125 MG SL tablet, Place 1 tablet (0.125 mg total) under the tongue every 4 (four) hours as needed., Disp: 90 tablet, Rfl: 1 .  LORazepam (ATIVAN) 0.5 MG tablet, Take 0.5 mg by mouth every 8 (eight) hours. 1-2 tabs prior to flight for anxiety, Disp: , Rfl:  .  Multiple Vitamin (MULTIVITAMIN) capsule, Take 1 capsule by mouth daily., Disp: , Rfl:  .  Olopatadine HCl 0.2 % SOLN, Apply 1 drop to eye daily., Disp: , Rfl:  .  Omega-3 Fatty Acids (FISH OIL) 1200 MG CAPS, Take 1 capsule by mouth 2 (two) times daily., Disp: , Rfl:  .  omeprazole (PRILOSEC) 20 MG capsule, Take 1 capsule (20 mg total) by mouth daily., Disp: 30 capsule, Rfl: 3  PAST MEDICAL HISTORY: Past Medical History:  Diagnosis Date  . Actinic keratitis  12/07/2016   shaved, deep margins- pt declined further biospy, aldara and  Excel V laser completed by derm  . Allergy   . Chicken pox   . Idiopathic chronic venous hypertension of both legs with inflammation   . Menorrhagia    control on BCP  . Multiple sclerosis ([facility]) 2005   diagnosed 2005; after optic Neuritis. Has IBS, fatiue and right arm paresthesia w/ flares  . Optic neuritis due to multiple sclerosis ([facility]) 2007   loss vision; IV steroids --> vision returned.   . Phobia, flying    uses ativan with flights  . Post partum depression   . Venous insufficiency (chronic) (peripheral)     PAST SURGICAL HISTORY: Past Surgical History:  Procedure Laterality Date  . DILATION AND EVACUATION  2003   nonviable pregnancy  . image: duplex LLE Left 05/16/2016   scanned into media.   . Image: MRI Brain  07/10/2017   Stable minor subcortical & periventricular white matter changes; MS  . LASER ABLATION Left 2014   left greater saphenous vein, then endoevous chemical ablation and sclerotherpay.   . procedure: Excel V laser  12/07/2016   Ak tip of nose shaved-deep margins-pt declined deeper bx, aldara cream and laser completed by derm.     FAMILY HISTORY: Family History  Problem Relation Age of Onset  . Breast cancer Mother   . Asthma Brother   . Depression Brother   . Drug abuse Brother   . Learning disabilities Brother   . Stroke Maternal Grandmother     SOCIAL HISTORY:  Social History   Socioeconomic History  . Marital status: Married    Spouse name: Aurther Loft  . Number of children: 1  . Years of education: BA  . Highest education level: Not on file  Occupational History  . Occupation: n/a  Tobacco Use  . Smoking status: Former Games developer  . Smokeless tobacco: Never Used  Substance and Sexual Activity  . Alcohol use: Not Currently  . Drug use: Never  . Sexual activity: Yes    Partners: Male  Other Topics Concern  . Not on file  Social History Narrative   Marital  status/children/pets: married, 1 child   Safety:      -smoke alarm in the home:Yes     - wears seatbelt: Yes     - Feels safe in their relationships: Yes      Right handed    Caffeine use: tea daily   Social Determinants of Health   Financial Resource Strain:   . Difficulty of Paying Living Expenses:   Food Insecurity:   . Worried About Programme researcher, broadcasting/film/video in the Last Year:   . Barista in the Last Year:   Transportation Needs:   . Freight forwarder (Medical):   [name] Lack of Transportation (Non-Medical):   Physical Activity:   . Days of Exercise per Week:   . Minutes of Exercise per Session:   Stress:   . Feeling of Stress :   Social Connections:   . Frequency of Communication with Friends and Family:   . Frequency of Social Gatherings with Friends and Family:   . Attends Religious Services:   . Active Member of Clubs or Organizations:   . Attends Banker Meetings:   [name] Marital Status:   Intimate Partner Violence:   . Fear of Current or Ex-Partner:   . Emotionally Abused:   [name] Physically Abused:   . Sexually Abused:      PHYSICAL EXAM (from 03/27/2018 initial consult)  Vitals:   05/15/19 0825  BP: 120/72  Pulse: 79  Temp: (!) 97.2 F (36.2 C)  Weight: 154 lb 8 oz (70.1 kg)  Height: 5' 4.1" (1.628 m)    Body mass index is 26.44 kg/m.   General: The patient is well-developed and well-nourished and in no acute distress  Skin: Extremities are without significant edema.  Neurologic Exam  Mental status: The patient is alert and oriented x 3 at the time of the examination. The patient has apparent normal recent and remote memory, with an apparently normal attention span and concentration ability.   Speech is normal.  Cranial nerves: Extraocular movements are full.. There is good facial sensation to soft touch bilaterally.Facial strength is normal.  Trapezius and sternocleidomastoid strength is normal. No dysarthria is noted. No obvious hearing  deficits are noted.  Motor:  Muscle bulk is normal.   Tone is normal. Strength is  5 / 5 in all 4 extremities.   Sensory: Sensory testing is intact to pinprick, soft touch and vibration sensation in all 4 extremities.  Coordination: Cerebellar testing reveals good finger-nose-finger and heel-to-shin bilaterally.  Gait and station: Station is normal.    Gait and tandem gait are normal.. Romberg is negative.   Reflexes: Deep tendon reflexes are symmetric and normal bilaterally.      Multiple sclerosis (HCC) - Plan: Hepatitis B core antibody, total, Hepatitis B surface antigen, HIV Antibody (routine testing w rflx), QuantiFERON-TB Gold Plus, Hepatitis B surface antibody,qualitative, CBC with Differential/Platelet, Comprehensive metabolic panel, Varicella zoster antibody, IgG  High risk medication use - Plan: Hepatitis B core antibody, total, Hepatitis B surface antigen, HIV Antibody (routine testing w rflx), QuantiFERON-TB Gold Plus, Hepatitis B surface antibody,qualitative, CBC with Differential/Platelet, Comprehensive metabolic  panel, Varicella zoster antibody, IgG  Numbness  History of optic neuritis  Lightheaded  Vertigo   1.   Due to difficulty tolerating Plegridy, we will switch to a different medication.  Her MS has been stable for many years.  We discussed various options in detail.  We spent the majority of the time discussing Vumerity/bafiertam and Mavenclad.  We went over the risks and benefits of both categories of medications.  We will check blood work to allow her to go on 1 of these medications.  She has already had both of her vaccination shots. 2.   Advised to stay active and exercise as tolerated. 3.   She will return to see me in 3-6 months, depending on which treatment she begins.  40-minute office visit with the majority of the time spent face-to-face for history and physical, discussion/counseling and decision-making.  Additional time with record review and  documentation.  Meryn Sarracino A. Epimenio Foot, MD, [facility] 05/15/2019, 2:07 PM Certified in Neurology, Clinical Neurophysiology, Sleep Medicine, Pain Medicine and Neuroimaging  [facility] [address] [place], [place] [address] [phone]

## 2019-05-18 LAB — CBC WITH DIFFERENTIAL/PLATELET
Basophils Absolute: 0 10*3/uL (ref 0.0–0.2)
Basos: 1 %
EOS (ABSOLUTE): 0.1 10*3/uL (ref 0.0–0.4)
Eos: 2 %
Hematocrit: 39.5 % (ref 34.0–46.6)
Hemoglobin: 12.9 g/dL (ref 11.1–15.9)
Immature Grans (Abs): 0 10*3/uL (ref 0.0–0.1)
Immature Granulocytes: 0 %
Lymphocytes Absolute: 2.5 10*3/uL (ref 0.7–3.1)
Lymphs: 43 %
MCH: 29.5 pg (ref 26.6–33.0)
MCHC: 32.7 g/dL (ref 31.5–35.7)
MCV: 90 fL (ref 79–97)
Monocytes Absolute: 0.3 10*3/uL (ref 0.1–0.9)
Monocytes: 5 %
Neutrophils Absolute: 3 10*3/uL (ref 1.4–7.0)
Neutrophils: 49 %
Platelets: 340 10*3/uL (ref 150–450)
RBC: 4.37 x10E6/uL (ref 3.77–5.28)
RDW: 13.1 % (ref 11.7–15.4)
WBC: 5.9 10*3/uL (ref 3.4–10.8)

## 2019-05-18 LAB — COMPREHENSIVE METABOLIC PANEL
ALT: 13 IU/L (ref 0–32)
AST: 13 IU/L (ref 0–40)
Albumin/Globulin Ratio: 1.5 (ref 1.2–2.2)
Albumin: 4.5 g/dL (ref 3.8–4.8)
Alkaline Phosphatase: 71 IU/L (ref 39–117)
BUN/Creatinine Ratio: 18 (ref 9–23)
BUN: 14 mg/dL (ref 6–24)
Bilirubin Total: <0.2 mg/dL (ref 0.0–1.2)
CO2: 27 mmol/L (ref 20–29)
Calcium: 9.7 mg/dL (ref 8.7–10.2)
Chloride: 104 mmol/L (ref 96–106)
Creatinine, Ser: 0.8 mg/dL (ref 0.57–1.00)
GFR calc Af Amer: 101 mL/min/{1.73_m2} (ref >59)
GFR calc non Af Amer: 87 mL/min/{1.73_m2} (ref >59)
Globulin, Total: 3 g/dL (ref 1.5–4.5)
Glucose: 100 mg/dL — ABNORMAL HIGH (ref 65–99)
Potassium: 4.6 mmol/L (ref 3.5–5.2)
Sodium: 141 mmol/L (ref 134–144)
Total Protein: 7.5 g/dL (ref 6.0–8.5)

## 2019-05-18 LAB — QUANTIFERON-TB GOLD PLUS
QuantiFERON Mitogen Value: >10 IU/mL
QuantiFERON Nil Value: 0.06 IU/mL
QuantiFERON TB1 Ag Value: 0.08 IU/mL
QuantiFERON TB2 Ag Value: 0.08 IU/mL
QuantiFERON-TB Gold Plus: NEGATIVE

## 2019-05-18 LAB — HIV ANTIBODY (ROUTINE TESTING W REFLEX): HIV Screen 4th Generation wRfx: NONREACTIVE

## 2019-05-18 LAB — HEPATITIS B SURFACE ANTIBODY,QUALITATIVE: Hep B Surface Ab, Qual: NONREACTIVE

## 2019-05-18 LAB — HEPATITIS B CORE ANTIBODY, TOTAL: Hep B Core Total Ab: NEGATIVE

## 2019-05-18 LAB — VARICELLA ZOSTER ANTIBODY, IGG: Varicella zoster IgG: 3661 index (ref >165)

## 2019-05-18 LAB — HEPATITIS B SURFACE ANTIGEN: Hepatitis B Surface Ag: NEGATIVE

## 2019-05-21 ENCOUNTER — Telehealth: Payer: Self-pay | Admitting: Neurology

## 2019-05-22 ENCOUNTER — Telehealth: Payer: Self-pay | Admitting: Neurology

## 2019-05-22 NOTE — Telephone Encounter (Signed)
I called her to discuss the question she had about starting Mavenclad.  We discussed vaccinations and timing and other issues.  She would like to start [facility] and we will send in the service request form.  She also qualifies for the 'CLICK' Mavenclad observational study and I will ask research to speak to her

## 2019-05-23 NOTE — Telephone Encounter (Signed)
Questions regarding Mavenclad were answered.

## 2019-05-23 NOTE — Telephone Encounter (Addendum)
Faxed printed/signed Mavenclad start form to [employer] at [phone]. Received fax confirmation.

## 2019-05-23 NOTE — Telephone Encounter (Addendum)
PA Mavenclad submitted on CMM. [id number] - PA Case ID: [id number].   QIHKVQ:[id number];FIEPPI:[id number];Review Type:Prior Auth;Coverage Start Date:04/23/2019;Coverage End Date:05/22/2020; Faxed notice of approval to [employer] at [phone]. Received fax confirmation. Called [employer] and spoke with Stanton Kidney. Provided PA approval info verbally as well.She verbalized understanding and appreciation for update.

## 2019-05-23 NOTE — Telephone Encounter (Signed)
Waiting on MD signature on start form. Once signed, I will fax in

## 2019-05-30 ENCOUNTER — Encounter (INDEPENDENT_AMBULATORY_CARE_PROVIDER_SITE_OTHER): Payer: Self-pay | Admitting: Otolaryngology

## 2019-05-30 ENCOUNTER — Other Ambulatory Visit: Payer: Self-pay

## 2019-05-30 ENCOUNTER — Ambulatory Visit (INDEPENDENT_AMBULATORY_CARE_PROVIDER_SITE_OTHER): Payer: Managed Care, Other (non HMO) | Admitting: Otolaryngology

## 2019-05-30 VITALS — Temp 97.9°F

## 2019-05-30 DIAGNOSIS — H6983 Other specified disorders of Eustachian tube, bilateral: Secondary | ICD-10-CM | POA: Diagnosis not present

## 2019-05-30 DIAGNOSIS — H9313 Tinnitus, bilateral: Secondary | ICD-10-CM

## 2019-05-30 NOTE — Telephone Encounter (Signed)
Received fax from [facility] that pt has scheduled delivery of Mavenclad and should receive it on 06/06/2019. [facility] ID: [id number].

## 2019-05-30 NOTE — Progress Notes (Signed)
HPI: Taylor Clements is a 50 y.o. female who presents for evaluation of tinnitus in her ears following the second Covid vaccine shot 3-1/2 weeks ago.  She also initially had a lot of fever and upper respiratory symptoms associated with a second shot she had a lot of congestion in her head as well as some slight dizziness.  She is done better this past week but still has ringing in the ear which is slightly worse on the right side.  She has not noted any hearing problems.  Denies loud noise exposure.  She has had some popping in her ears when she swallows. She does have history of seasonal allergies and takes Allegra.  Past Medical History:  Diagnosis Date  . Actinic keratitis 12/07/2016   shaved, deep margins- pt declined further biospy, aldara and  Excel V laser completed by derm  . Allergy   . Chicken pox   . Idiopathic chronic venous hypertension of both legs with inflammation   . Menorrhagia    control on BCP  . Multiple sclerosis (HCC) 2005   diagnosed 2005; after optic Neuritis. Has IBS, fatiue and right arm paresthesia w/ flares  . Optic neuritis due to multiple sclerosis (HCC) 2007   loss vision; IV steroids --> vision returned.   . Phobia, flying    uses ativan with flights  . Post partum depression   . Venous insufficiency (chronic) (peripheral)    Past Surgical History:  Procedure Laterality Date  . DILATION AND EVACUATION  2003   nonviable pregnancy  . image: duplex LLE Left 05/16/2016   scanned into media.   . Image: MRI Brain  07/10/2017   Stable minor subcortical & periventricular white matter changes; MS  . LASER ABLATION Left 2014   left greater saphenous vein, then endoevous chemical ablation and sclerotherpay.   . procedure: Excel V laser  12/07/2016   Ak tip of nose shaved-deep margins-pt declined deeper bx, aldara cream and laser completed by derm.    Social History   Socioeconomic History  . Marital status: Married    Spouse name: Aurther Loft  . Number of  children: 1  . Years of education: BA  . Highest education level: Not on file  Occupational History  . Occupation: n/a  Tobacco Use  . Smoking status: Never Smoker  . Smokeless tobacco: Never Used  Substance and Sexual Activity  . Alcohol use: Not Currently  . Drug use: Never  . Sexual activity: Yes    Partners: Male  Other Topics Concern  . Not on file  Social History Narrative   Marital status/children/pets: married, 1 child   Safety:      -smoke alarm in the home:Yes     - wears seatbelt: Yes     - Feels safe in their relationships: Yes      Right handed    Caffeine use: tea daily   Social Determinants of Health   Financial Resource Strain:   . Difficulty of Paying Living Expenses:   Food Insecurity:   . Worried About Programme researcher, broadcasting/film/video in the Last Year:   . Barista in the Last Year:   Transportation Needs:   . Freight forwarder (Medical):   [name] Lack of Transportation (Non-Medical):   Physical Activity:   . Days of Exercise per Week:   . Minutes of Exercise per Session:   Stress:   . Feeling of Stress :   Social Connections:   . Frequency of Communication  with Friends and Family:   . Frequency of Social Gatherings with Friends and Family:   . Attends Religious Services:   . Active Member of Clubs or Organizations:   . Attends Archivist Meetings:   [name] Marital Status:    Family History  Problem Relation Age of Onset  . Breast cancer Mother   . Asthma Brother   . Depression Brother   . Drug abuse Brother   . Learning disabilities Brother   . Stroke Maternal Grandmother    No Known Allergies Prior to Admission medications   Medication Sig Start Date End Date Taking? Authorizing Provider  Calcium 500-125 MG-UNIT TABS Take 2 tablets by mouth daily.   Yes [provider]  Cladribine, 7 Tabs, (MAVENCLAD, 7 TABS,) 10 MG TBPK Take by mouth. Year 1 Month 1 and 2: Days 1-2: 2 tabs per day and Days 3-5: 1 tab per day to equal 7 tabs  total   Yes [provider]  colchicine 0.6 MG tablet Take 1 tab daily on day 3 -6 after Plegridy injection 04/03/19  Yes Kuneff, Renee A, DO  hyoscyamine (LEVSIN SL) 0.125 MG SL tablet Place 1 tablet (0.125 mg total) under the tongue every 4 (four) hours as needed. 12/27/17  Yes Kuneff, Renee A, DO  LORazepam (ATIVAN) 0.5 MG tablet Take 0.5 mg by mouth every 8 (eight) hours. 1-2 tabs prior to flight for anxiety   Yes [provider]  Multiple Vitamin (MULTIVITAMIN) capsule Take 1 capsule by mouth daily.   Yes [provider]  Olopatadine HCl 0.2 % SOLN Apply 1 drop to eye daily.   Yes [provider]  Omega-3 Fatty Acids (FISH OIL) 1200 MG CAPS Take 1 capsule by mouth 2 (two) times daily.   Yes [provider]  omeprazole (PRILOSEC) 20 MG capsule Take 1 capsule (20 mg total) by mouth daily. 05/02/19  Yes Kuneff, Renee A, DO     Positive ROS: Otherwise negative  All other systems have been reviewed and were otherwise negative with the exception of those mentioned in the HPI and as above.  Physical Exam: Constitutional: Alert, well-appearing, no acute distress Ears: External ears without lesions or tenderness. Ear canals are clear bilaterally.  TMs are clear bilaterally with good mobility pneumatic otoscopy.  Hearing screening with the 1024 tuning fork revealed good hearing in both ears perhaps slightly better in the left compared to the right. Nasal: External nose without lesions. Septum slightly deviated to the right.  Minimal rhinitis.. Clear nasal passages bilaterally with no signs of infection. Oral: Lips and gums without lesions. Tongue and palate mucosa without lesions. Posterior oropharynx clear. Neck: No palpable adenopathy or masses. Respiratory: Breathing comfortably  Skin: No facial/neck lesions or rash noted.  Procedures  Assessment: Tinnitus following second Covid vaccine. Possible eustachian tube dysfunction contributing to popping in  the ear.  Plan: Discussed with her concerning limited treatment for tinnitus.  Discussed with her concerning using masking noise if the tinnitus is bad.  Hopefully this will spontaneously improve with time.  If she still has persistent tinnitus after 5 to 6 weeks she will call us back to schedule audiologic testing. Did prescribe Nasacort 2 sprays each nostril at night to help with allergies as well as hopefully with the ear popping.  Radene Journey, MD

## 2019-07-08 ENCOUNTER — Ambulatory Visit: Payer: Self-pay | Admitting: Neurology

## 2019-07-16 ENCOUNTER — Telehealth: Payer: Self-pay

## 2019-07-16 NOTE — Telephone Encounter (Signed)
Mavenclad(cladribine) form fax from [facility] sent stating:  [facility] [id number] Last day of treatment was 07/13/2019 PT has completed the first year of treatment [facility] number is [phone] for any questions. Form sent to scanning for processing.

## 2019-07-22 ENCOUNTER — Encounter: Payer: Self-pay | Admitting: Family Medicine

## 2019-07-25 NOTE — Telephone Encounter (Signed)
Patient called. She has just started a new job. She is in her 90 day probationary period. Only time that can be missed, that was pre-approved, was her already planned vacation trip to [place].  She needs prescription to fly - Lorazepam. She also wants to briefly update Dr. Claiborne Billings on Plegridy.   She could only come to in office appts after 4:30pm, which I told her we do not have here at this location. I asked her was she able to do a virtual call during her lunch time.  She said yes. I scheduled a mychart visit for 7/16 at 1pm with Dr. Claiborne Billings Patient is very happy.

## 2019-08-07 ENCOUNTER — Other Ambulatory Visit: Payer: Self-pay

## 2019-08-07 ENCOUNTER — Encounter: Payer: Self-pay | Admitting: Neurology

## 2019-08-07 ENCOUNTER — Ambulatory Visit: Payer: Managed Care, Other (non HMO) | Admitting: Neurology

## 2019-08-07 VITALS — BP 112/75 | HR 92 | Ht 64.1 in | Wt 145.0 lb

## 2019-08-07 DIAGNOSIS — R200 Anesthesia of skin: Secondary | ICD-10-CM | POA: Diagnosis not present

## 2019-08-07 DIAGNOSIS — H9319 Tinnitus, unspecified ear: Secondary | ICD-10-CM

## 2019-08-07 DIAGNOSIS — Z79899 Other long term (current) drug therapy: Secondary | ICD-10-CM | POA: Diagnosis not present

## 2019-08-07 DIAGNOSIS — G35 Multiple sclerosis: Secondary | ICD-10-CM | POA: Diagnosis not present

## 2019-08-07 NOTE — Progress Notes (Signed)
[facility]  PATIENT: Taylor Clements DOB: 06/11/1970  REFERRING DOCTOR OR PCP:  Felix Pacini SOURCE: patient, notes from Dr. Claiborne Billings,   _________________________________   HISTORICAL  CHIEF COMPLAINT:  She is a 49 y.o. woman with relapsing remitting multiple sclerosis diagnosed in 2005  Update 08/07/2019: She feels her MS is stable.   She had her first year of Mavenclad in May 18-22 and Jul 08, 2019.   She had headaches a few days with the first 5 days and milder with the second course.     She denies any problems with gait, balance, strength and sensation.    Bladder function is fine.   Vision is fine (needs readers and gets some eye strain though).     She had tinnitus after her 2nd Pfizer vaccination.    She denies fatigue and sleeps well most nights.    She has recently started a job.   She works as a Energy manager.    Mood is good.   She denies cognitive issues.   Her last MRI 04/17/19 showed scattered T2/FLAIR hyperintense foci in the periventricular, juxtacortical and deep white matter of the hemispheres in a pattern and configuration compatible with chronic demyelinating plaque associated with multiple sclerosis.  None the foci appear to be acute.  They do not enhance.  Compared to the MRI dated 07/10/2017, there are no new lesions.  MS History:  She was diagnosed with MS in late 2005.  She first presented with right optic neuritis that year.   She was treated with several days of IV Solu-Medrol and vision improved.   An MRI was consistent with MS.   She was initially placed on Avonex late 2005 or early 2006.  Around 2014, after Plegridy was approved, she switched from Avonex to Plegridy.   She has no definite exacerbation.  However, about 5 years ago she began to notes spasticity in her right arm and more fatigue.     She was placed on baclofen but only took it about a month or less.    The spasticity improved.     She has not been on any treatment for her  fatigue.   Imaging studies: MRI of the brain performed in 2018.  It shows 6 or 7 small T2/flair hyperintense foci, 2 in the periventricular white matter and the rest in the subcortical and deep white matter.  None of the foci appear to be acute.  There was no atrophy.  There are no lesions in the infratentorial white matter.  None of the foci enhance.  MRI 04/17/19 showed scattered T2/FLAIR hyperintense foci in the periventricular, juxtacortical and deep white matter of the hemispheres in a pattern and configuration compatible with chronic demyelinating plaque associated with multiple sclerosis.  None the foci appear to be acute.  They do not enhance.  Compared to the MRI dated 07/10/2017, there are no new lesions.  She has no FH of MS or other autoimmune disorder.        REVIEW OF SYSTEMS: Constitutional: No fevers, chills, sweats, or change in appetite.  She has fatigue.    Sleeps well Eyes: No visual changes, double vision, eye pain Ear, nose and throat: No hearing loss, ear pain, nasal congestion, sore throat Cardiovascular: No chest pain, palpitations Respiratory: No shortness of breath at rest or with exertion.   No wheezes GastrointestinaI: No nausea, vomiting, abdominal pain, fecal incontinence.  Occ diarrhea, maybe IBS.  Genitourinary: No dysuria,.  No nocturia.  Some  urgency Musculoskeletal: No neck pain, back pain Integumentary: No rash, pruritus, skin lesions Neurological: as above Psychiatric: No depression at this time.  No anxiety Endocrine: No palpitations, diaphoresis, change in appetite, change in weigh or increased thirst Hematologic/Lymphatic: No anemia, purpura, petechiae. Allergic/Immunologic: No itchy/runny eyes, nasal congestion, recent allergic reactions, rashes  ALLERGIES: No Known Allergies  HOME MEDICATIONS:  Current Outpatient Medications:    Calcium 500-125 MG-UNIT TABS, Take 2 tablets by mouth daily., Disp: , Rfl:    Cladribine, 7 Tabs, (MAVENCLAD, 7  TABS,) 10 MG TBPK, Take by mouth. Year 1 Month 1 and 2: Days 1-2: 2 tabs per day and Days 3-5: 1 tab per day to equal 7 tabs total, Disp: , Rfl:    colchicine 0.6 MG tablet, Take 1 tab daily on day 3 -6 after Plegridy injection, Disp: 10 tablet, Rfl: 1   hyoscyamine (LEVSIN SL) 0.125 MG SL tablet, Place 1 tablet (0.125 mg total) under the tongue every 4 (four) hours as needed., Disp: 90 tablet, Rfl: 1   LORazepam (ATIVAN) 0.5 MG tablet, Take 0.5 mg by mouth every 8 (eight) hours. 1-2 tabs prior to flight for anxiety, Disp: , Rfl:    Multiple Vitamin (MULTIVITAMIN) capsule, Take 1 capsule by mouth daily., Disp: , Rfl:    Olopatadine HCl 0.2 % SOLN, Apply 1 drop to eye daily., Disp: , Rfl:    Omega-3 Fatty Acids (FISH OIL) 1200 MG CAPS, Take 1 capsule by mouth 2 (two) times daily., Disp: , Rfl:    omeprazole (PRILOSEC) 20 MG capsule, Take 1 capsule (20 mg total) by mouth daily., Disp: 30 capsule, Rfl: 3  PAST MEDICAL HISTORY: Past Medical History:  Diagnosis Date   Actinic keratitis 12/07/2016   shaved, deep margins- pt declined further biospy, aldara and  Excel V laser completed by derm   Allergy    Chicken pox    Idiopathic chronic venous hypertension of both legs with inflammation    Menorrhagia    control on BCP   Multiple sclerosis (HCC) 2005   diagnosed 2005; after optic Neuritis. Has IBS, fatiue and right arm paresthesia w/ flares   Optic neuritis due to multiple sclerosis (HCC) 2007   loss vision; IV steroids --> vision returned.    Phobia, flying    uses ativan with flights   Post partum depression    Venous insufficiency (chronic) (peripheral)     PAST SURGICAL HISTORY: Past Surgical History:  Procedure Laterality Date   DILATION AND EVACUATION  2003   nonviable pregnancy   image: duplex LLE Left 05/16/2016   scanned into media.    Image: MRI Brain  07/10/2017   Stable minor subcortical & periventricular white matter changes; MS   LASER ABLATION Left  2014   left greater saphenous vein, then endoevous chemical ablation and sclerotherpay.    procedure: Excel V laser  12/07/2016   Ak tip of nose shaved-deep margins-pt declined deeper bx, aldara cream and laser completed by derm.     FAMILY HISTORY: Family History  Problem Relation Age of Onset   Breast cancer Mother    Asthma Brother    Depression Brother    Drug abuse Brother    Learning disabilities Brother    Stroke Maternal Grandmother     SOCIAL HISTORY:  Social History   Socioeconomic History   Marital status: Married    Spouse name: Aurther Loft   Number of children: 1   Years of education: BA   Highest education level: Not on  file  Occupational History   Occupation: n/a  Tobacco Use   Smoking status: Never Smoker   Smokeless tobacco: Never Used  Vaping Use   Vaping Use: Never used  Substance and Sexual Activity   Alcohol use: Not Currently   Drug use: Never   Sexual activity: Yes    Partners: Male  Other Topics Concern   Not on file  Social History Narrative   Marital status/children/pets: married, 1 child   Safety:      -smoke alarm in the home:Yes     - wears seatbelt: Yes     - Feels safe in their relationships: Yes      Right handed    Caffeine use: tea daily   Social Determinants of Health   Financial Resource Strain:    Difficulty of Paying Living Expenses:   Food Insecurity:    Worried About Programme researcher, broadcasting/film/video in the Last Year:    Barista in the Last Year:   Transportation Needs:    Freight forwarder (Medical):    Lack of Transportation (Non-Medical):   Physical Activity:    Days of Exercise per Week:    Minutes of Exercise per Session:   Stress:    Feeling of Stress :   Social Connections:    Frequency of Communication with Friends and Family:    Frequency of Social Gatherings with Friends and Family:    Attends Religious Services:    Active Member of Clubs or Organizations:    Attends Occupational hygienist Meetings:    Marital Status:   Intimate Partner Violence:    Fear of Current or Ex-Partner:    Emotionally Abused:    Physically Abused:    Sexually Abused:      PHYSICAL EXAM (from 03/27/2018 initial consult)  Vitals:   08/07/19 1321  BP: 112/75  Pulse: 92  Weight: 145 lb (65.8 kg)  Height: 5' 4.1" (1.628 m)    Body mass index is 24.81 kg/m.   General: The patient is well-developed and well-nourished and in no acute distress  Skin: Extremities are without significant edema.  Neurologic Exam  Mental status: The patient is alert and oriented x 3 at the time of the examination. The patient has apparent normal recent and remote memory, with an apparently normal attention span and concentration ability.   Speech is normal.  Cranial nerves: Extraocular movements are full.. There is good facial sensation to soft touch bilaterally.Facial strength is normal.  Trapezius and sternocleidomastoid strength is normal. No dysarthria is noted. No obvious hearing deficits are noted.  Motor:  Muscle bulk is normal.   Tone is normal. Strength is  5 / 5 in all 4 extremities.   Sensory: Sensory testing is intact to pinprick, soft touch and vibration sensation in all 4 extremities.  Coordination: Cerebellar testing reveals good finger-nose-finger and heel-to-shin bilaterally.  Gait and station: Station is normal.    Gait is normal.  Tandem gait is minimally wide. .. Romberg is negative.   Reflexes: Deep tendon reflexes are symmetric and normal bilaterally.      Multiple sclerosis (HCC) - Plan: CBC with Differential/Platelet, Comprehensive metabolic panel  High risk medication use - Plan: CBC with Differential/Platelet, Comprehensive metabolic panel  Numbness  Tinnitus, unspecified laterality   1.    She did well with the first 2 months of Mavenclad just having some headaches as a side effect.  She feels fine now.  She is likely at the nadir  of her lymphocyte count  and we will check blood work for CBC with differential and CMP. 2.   Stay active and exercise as tolerated. 3.   She will return to see me in 4 months, depending on which treatment she begins.   Rashee Marschall A. Epimenio Foot, MD, [facility] 08/07/2019, 2:00 PM Certified in Neurology, Clinical Neurophysiology, Sleep Medicine, Pain Medicine and Neuroimaging  [facility] [address] [place], [place] [address] [phone]

## 2019-08-08 LAB — COMPREHENSIVE METABOLIC PANEL
ALT: 9 IU/L (ref 0–32)
AST: 15 IU/L (ref 0–40)
Albumin/Globulin Ratio: 1.4 (ref 1.2–2.2)
Albumin: 4.3 g/dL (ref 3.8–4.8)
Alkaline Phosphatase: 70 IU/L (ref 48–121)
BUN/Creatinine Ratio: 20 (ref 9–23)
BUN: 17 mg/dL (ref 6–24)
Bilirubin Total: 0.3 mg/dL (ref 0.0–1.2)
CO2: 22 mmol/L (ref 20–29)
Calcium: 9.4 mg/dL (ref 8.7–10.2)
Chloride: 103 mmol/L (ref 96–106)
Creatinine, Ser: 0.85 mg/dL (ref 0.57–1.00)
GFR calc Af Amer: 93 mL/min/{1.73_m2} (ref >59)
GFR calc non Af Amer: 81 mL/min/{1.73_m2} (ref >59)
Globulin, Total: 3.1 g/dL (ref 1.5–4.5)
Glucose: 107 mg/dL — ABNORMAL HIGH (ref 65–99)
Potassium: 4.1 mmol/L (ref 3.5–5.2)
Sodium: 138 mmol/L (ref 134–144)
Total Protein: 7.4 g/dL (ref 6.0–8.5)

## 2019-08-08 LAB — CBC WITH DIFFERENTIAL/PLATELET
Basophils Absolute: 0 10*3/uL (ref 0.0–0.2)
Basos: 1 %
EOS (ABSOLUTE): 0.1 10*3/uL (ref 0.0–0.4)
Eos: 1 %
Hematocrit: 35.8 % (ref 34.0–46.6)
Hemoglobin: 12.5 g/dL (ref 11.1–15.9)
Immature Grans (Abs): 0 10*3/uL (ref 0.0–0.1)
Immature Granulocytes: 0 %
Lymphocytes Absolute: 1.6 10*3/uL (ref 0.7–3.1)
Lymphs: 32 %
MCH: 29.8 pg (ref 26.6–33.0)
MCHC: 34.9 g/dL (ref 31.5–35.7)
MCV: 85 fL (ref 79–97)
Monocytes Absolute: 0.4 10*3/uL (ref 0.1–0.9)
Monocytes: 8 %
Neutrophils Absolute: 2.9 10*3/uL (ref 1.4–7.0)
Neutrophils: 58 %
Platelets: 261 10*3/uL (ref 150–450)
RBC: 4.19 x10E6/uL (ref 3.77–5.28)
RDW: 13.2 % (ref 11.7–15.4)
WBC: 4.9 10*3/uL (ref 3.4–10.8)

## 2019-08-09 ENCOUNTER — Telehealth (INDEPENDENT_AMBULATORY_CARE_PROVIDER_SITE_OTHER): Payer: Managed Care, Other (non HMO) | Admitting: Family Medicine

## 2019-08-09 ENCOUNTER — Other Ambulatory Visit: Payer: Self-pay

## 2019-08-09 ENCOUNTER — Encounter: Payer: Self-pay | Admitting: Family Medicine

## 2019-08-09 DIAGNOSIS — F40243 Fear of flying: Secondary | ICD-10-CM | POA: Diagnosis not present

## 2019-08-09 MED ORDER — LORAZEPAM 1 MG PO TABS: 1.0000 mg | ORAL_TABLET | Freq: Two times a day (BID) | ORAL | 0 refills | Status: DC | PRN

## 2019-08-09 NOTE — Progress Notes (Signed)
[name]  VIRTUAL VISIT VIA VIDEO  I connected with Taylor Clements on 08/09/19 at  1:00 PM EDT by a video enabled telemedicine application and verified that I am speaking with the correct person using two identifiers. Location patient: Home Location provider: [facility], Office Persons participating in the virtual visit: Patient, Dr. Claiborne Billings and R.Baker, LPN  I discussed the limitations of evaluation and management by telemedicine and the availability of in person appointments. The patient expressed understanding and agreed to proceed.   Taylor Clements , 11/01/1970, 49 y.o., female MRN: [id number] Patient Care Team    Relationship Specialty Notifications Start End  Natalia Leatherwood, DO PCP - General Family Medicine  11/21/17     Chief Complaint  Patient presents with  . Anxiety     Subjective: Pt presents for an OV with concerns of upcoming flight to [facility] planned. She has anxiety surround airplane/flights. She has used ativan 1 mg in the past for anxiety d/y flights and it has worked well for her.   Depression screen [facility] 2/9 03/12/2019 12/27/2017 11/21/2017  Decreased Interest 0 0 0  Down, Depressed, Hopeless 0 0 0  PHQ - 2 Score 0 0 0  Altered sleeping - 0 -  Tired, decreased energy - 1 -  Change in appetite - 0 -  Feeling bad or failure about yourself  - 0 -  Trouble concentrating - 0 -  Moving slowly or fidgety/restless - 0 -  Suicidal thoughts - 0 -  PHQ-9 Score - 1 -  Difficult doing work/chores - Not difficult at all -    No Known Allergies Social History   Social History Narrative   Marital status/children/pets: married, 1 child   Safety:      -smoke alarm in the home:Yes     - wears seatbelt: Yes     - Feels safe in their relationships: Yes      Right handed    Caffeine use: tea daily   Past Medical History:  Diagnosis Date  . Actinic keratitis 12/07/2016   shaved, deep margins- pt declined further biospy, aldara and  Excel V laser  completed by derm  . Allergy   . Chicken pox   . Idiopathic chronic venous hypertension of both legs with inflammation   . Menorrhagia    control on BCP  . Multiple sclerosis (HCC) 2005   diagnosed 2005; after optic Neuritis. Has IBS, fatiue and right arm paresthesia w/ flares  . Optic neuritis due to multiple sclerosis (HCC) 2007   loss vision; IV steroids --> vision returned.   . Phobia, flying    uses ativan with flights  . Post partum depression   . Venous insufficiency (chronic) (peripheral)    Past Surgical History:  Procedure Laterality Date  . DILATION AND EVACUATION  2003   nonviable pregnancy  . image: duplex LLE Left 05/16/2016   scanned into media.   . Image: MRI Brain  07/10/2017   Stable minor subcortical & periventricular white matter changes; MS  . LASER ABLATION Left 2014   left greater saphenous vein, then endoevous chemical ablation and sclerotherpay.   . procedure: Excel V laser  12/07/2016   Ak tip of nose shaved-deep margins-pt declined deeper bx, aldara cream and laser completed by derm.    Family History  Problem Relation Age of Onset  . Breast cancer Mother   . Asthma Brother   . Depression Brother   . Drug abuse Brother   .  Learning disabilities Brother   . Stroke Maternal Grandmother    Allergies as of 08/09/2019   No Known Allergies     Medication List       Accurate as of August 09, 2019  1:06 PM. If you have any questions, ask your nurse or doctor.        Calcium 500-125 MG-UNIT Tabs Take 2 tablets by mouth daily.   colchicine 0.6 MG tablet Take 1 tab daily on day 3 -6 after Plegridy injection   Fish Oil 1200 MG Caps Take 1 capsule by mouth 2 (two) times daily.   hyoscyamine 0.125 MG SL tablet Commonly known as: LEVSIN SL Place 1 tablet (0.125 mg total) under the tongue every 4 (four) hours as needed.   LORazepam 0.5 MG tablet Commonly known as: ATIVAN Take 0.5 mg by mouth every 8 (eight) hours. 1-2 tabs prior to flight for  anxiety   Mavenclad (7 Tabs) 10 MG Tbpk Generic drug: Cladribine (7 Tabs) Take by mouth. Year 1 Month 1 and 2: Days 1-2: 2 tabs per day and Days 3-5: 1 tab per day to equal 7 tabs total   multivitamin capsule Take 1 capsule by mouth daily.   Olopatadine HCl 0.2 % Soln Apply 1 drop to eye daily.   omeprazole 20 MG capsule Commonly known as: PRILOSEC Take 1 capsule (20 mg total) by mouth daily.       All past medical history, surgical history, allergies, family history, immunizations andmedications were updated in the EMR today and reviewed under the history and medication portions of their EMR.     ROS: Negative, with the exception of above mentioned in HPI   Objective:  LMP 07/19/2019 (Exact Date)  There is no height or weight on file to calculate BMI. Gen: Afebrile. No acute distress. Nontoxic in appearance, well developed, well nourished.  HENT: AT. [place].   Neuro:i. Alert. Oriented x3  Psych: Normal affect, dress and demeanor. Normal speech. Normal thought content and judgment.  No exam data present No results found. No results found for this or any previous visit (from the past 24 hour(s)).  Assessment/Plan: Taylor Clements is a 49 y.o. female present for OV for  Anxiety with flying Upcoming trip to [facility] planned.  Ativan 1 mg BID PRN surrounding flight prescribed #10-no refills.  NCCS database reviewed.     Reviewed expectations re: course of current medical issues.  Discussed self-management of symptoms.  Outlined signs and symptoms indicating need for more acute intervention.  Patient verbalized understanding and all questions were answered.  Patient received an After-Visit Summary.    No orders of the defined types were placed in this encounter.  No orders of the defined types were placed in this encounter.  Referral Orders  No referral(s) requested today     Note is dictated utilizing voice recognition software. Although note has been  proof read prior to signing, occasional typographical errors still can be missed. If any questions arise, please do not hesitate to call for verification.   electronically signed by:  Felix Pacini, DO  [facility] [place]

## 2019-09-25 ENCOUNTER — Ambulatory Visit: Payer: Managed Care, Other (non HMO) | Admitting: Neurology

## 2019-11-20 ENCOUNTER — Encounter: Payer: Self-pay | Admitting: Family Medicine

## 2019-11-20 ENCOUNTER — Ambulatory Visit: Payer: Managed Care, Other (non HMO) | Admitting: Family Medicine

## 2019-11-20 ENCOUNTER — Other Ambulatory Visit: Payer: Self-pay

## 2019-11-20 VITALS — BP 116/68 | HR 78 | Temp 98.0°F | Resp 16 | Ht 64.1 in | Wt 146.2 lb

## 2019-11-20 DIAGNOSIS — R7309 Other abnormal glucose: Secondary | ICD-10-CM

## 2019-11-20 LAB — POCT GLYCOSYLATED HEMOGLOBIN (HGB A1C)
HbA1c POC (<> result, manual entry): 5.2 % (ref 4.0–5.6)
HbA1c, POC (controlled diabetic range): 5.2 % (ref 0.0–7.0)
HbA1c, POC (prediabetic range): 5.2 % — AB (ref 5.7–6.4)
Hemoglobin A1C: 5.2 % (ref 4.0–5.6)

## 2019-11-20 NOTE — Progress Notes (Signed)
This visit occurred during the SARS-CoV-2 public health emergency.  Safety protocols were in place, including screening questions prior to the visit, additional usage of staff PPE, and extensive cleaning of exam room while observing appropriate contact time as indicated for disinfecting solutions.    Taylor Clements , August 12, 1970, 49 y.o., female MRN: [id number] Patient Care Team    Relationship Specialty Notifications Start End  Natalia Leatherwood, DO PCP - General Family Medicine  11/21/17     Chief Complaint  Patient presents with  . Follow-up    Pre-diabetes     Subjective: Pt presents for an OV to follow up on elevated a1c. She reports she has made dietary changes- decreasing sugars and carbs, avoiding biscuits. She has also started back exercising. She has lost almost 10 lbs since her elevated a1c on Feb of 6.2.   Depression screen [facility] 2/9 03/12/2019 12/27/2017 11/21/2017  Decreased Interest 0 0 0  Down, Depressed, Hopeless 0 0 0  PHQ - 2 Score 0 0 0  Altered sleeping - 0 -  Tired, decreased energy - 1 -  Change in appetite - 0 -  Feeling bad or failure about yourself  - 0 -  Trouble concentrating - 0 -  Moving slowly or fidgety/restless - 0 -  Suicidal thoughts - 0 -  PHQ-9 Score - 1 -  Difficult doing work/chores - Not difficult at all -    No Known Allergies Social History   Social History Narrative   Marital status/children/pets: married, 1 child   Safety:      -smoke alarm in the home:Yes     - wears seatbelt: Yes     - Feels safe in their relationships: Yes      Right handed    Caffeine use: tea daily   Past Medical History:  Diagnosis Date  . Actinic keratitis 12/07/2016   shaved, deep margins- pt declined further biospy, aldara and  Excel V laser completed by derm  . Allergy   . Chicken pox   . Idiopathic chronic venous hypertension of both legs with inflammation   . Menorrhagia    control on BCP  . Multiple sclerosis (HCC) 2005   diagnosed  2005; after optic Neuritis. Has IBS, fatiue and right arm paresthesia w/ flares  . Optic neuritis due to multiple sclerosis (HCC) 2007   loss vision; IV steroids --> vision returned.   . Phobia, flying    uses ativan with flights  . Post partum depression   . Venous insufficiency (chronic) (peripheral)    Past Surgical History:  Procedure Laterality Date  . DILATION AND EVACUATION  2003   nonviable pregnancy  . image: duplex LLE Left 05/16/2016   scanned into media.   . Image: MRI Brain  07/10/2017   Stable minor subcortical & periventricular white matter changes; MS  . LASER ABLATION Left 2014   left greater saphenous vein, then endoevous chemical ablation and sclerotherpay.   . procedure: Excel V laser  12/07/2016   Ak tip of nose shaved-deep margins-pt declined deeper bx, aldara cream and laser completed by derm.    Family History  Problem Relation Age of Onset  . Breast cancer Mother   . Asthma Brother   . Depression Brother   . Drug abuse Brother   . Learning disabilities Brother   . Stroke Maternal Grandmother    Allergies as of 11/20/2019   No Known Allergies     Medication List  Accurate as of November 20, 2019  8:31 AM. If you have any questions, ask your nurse or doctor.        STOP taking these medications   colchicine 0.6 MG tablet Stopped by: Felix Pacini, DO   omeprazole 20 MG capsule Commonly known as: PRILOSEC Stopped by: Felix Pacini, DO     TAKE these medications   Calcium 500-125 MG-UNIT Tabs Take 2 tablets by mouth daily.   Fish Oil 1200 MG Caps Take 1 capsule by mouth 2 (two) times daily.   hyoscyamine 0.125 MG SL tablet Commonly known as: LEVSIN SL Place 1 tablet (0.125 mg total) under the tongue every 4 (four) hours as needed.   LORazepam 1 MG tablet Commonly known as: ATIVAN Take 1 tablet (1 mg total) by mouth every 12 (twelve) hours as needed for anxiety.   Mavenclad (7 Tabs) 10 MG Tbpk Generic drug: Cladribine (7  Tabs) Take by mouth. Year 1 Month 1 and 2: Days 1-2: 2 tabs per day and Days 3-5: 1 tab per day to equal 7 tabs total   multivitamin capsule Take 1 capsule by mouth daily.   Olopatadine HCl 0.2 % Soln Apply 1 drop to eye daily.       All past medical history, surgical history, allergies, family history, immunizations andmedications were updated in the EMR today and reviewed under the history and medication portions of their EMR.     ROS: Negative, with the exception of above mentioned in HPI   Objective:  BP 116/68 (BP Location: Left Arm, Patient Position: Sitting, Cuff Size: Normal)   Pulse 78   Temp 98 F (36.7 C) (Oral)   Resp 16   Ht 5' 4.1" (1.628 m)   Wt 146 lb 3.2 oz (66.3 kg)   SpO2 100%   BMI 25.02 kg/m  Body mass index is 25.02 kg/m. Gen: Afebrile. No acute distress. Nontoxic in appearance, well developed, well nourished.  HENT: AT. [place].  Eyes:Pupils Equal Round Reactive to light, Extraocular movements intact,  Conjunctiva without redness, discharge or icterus. CV: RRR no murmur Chest: CTAB, no wheeze or crackles. Good air movement, normal resp effort.  Skin: no rashes, purpura or petechiae.  Neuro:  Normal gait. PERLA. EOMi. Alert. Oriented x3  No exam data present No results found. Results for orders placed or performed in visit on 11/20/19 (from the past 24 hour(s))  POCT glycosylated hemoglobin (Hb A1C)     Status: Abnormal   Collection Time: 11/20/19  8:20 AM  Result Value Ref Range   Hemoglobin A1C 5.2 4.0 - 5.6 %   HbA1c POC (<> result, manual entry) 5.2 4.0 - 5.6 %   HbA1c, POC (prediabetic range) 5.2 (A) 5.7 - 6.4 %   HbA1c, POC (controlled diabetic range) 5.2 0.0 - 7.0 %    Assessment/Plan: Taylor Clements is a 49 y.o. female present for OV for  Elevated hemoglobin A1c Congratulated her on the 10 lb weight loss and decreasing her a1c by a whole point.  Continue diet and exercise.  - POCT glycosylated hemoglobin (Hb A1C) 6.2>5.2  today! Will return to follow a1c yearly with cpe as long as in normal range.  cpe due 03/12/2020   Reviewed expectations re: course of current medical issues.  Discussed self-management of symptoms.  Outlined signs and symptoms indicating need for more acute intervention.  Patient verbalized understanding and all questions were answered.  Patient received an After-Visit Summary.    Orders Placed This Encounter  Procedures  .  POCT glycosylated hemoglobin (Hb A1C)   No orders of the defined types were placed in this encounter.  Referral Orders  No referral(s) requested today     Note is dictated utilizing voice recognition software. Although note has been proof read prior to signing, occasional typographical errors still can be missed. If any questions arise, please do not hesitate to call for verification.   electronically signed by:  Felix Pacini, DO  [facility] - [place]

## 2019-11-20 NOTE — Patient Instructions (Signed)
Great job. A1c 6.2> 5.2 today!!!     Preventing Type 2 Diabetes Mellitus Type 2 diabetes (type 2 diabetes mellitus) is a long-term (chronic) disease that affects blood sugar (glucose) levels. Normally, a hormone called insulin allows glucose to enter cells in the body. The cells use glucose for energy. In type 2 diabetes, one or both of these problems may be present:  The body does not make enough insulin.  The body does not respond properly to insulin that it makes (insulin resistance). Insulin resistance or lack of insulin causes excess glucose to build up in the blood instead of going into cells. As a result, high blood glucose (hyperglycemia) develops, which can cause many complications. Being overweight or obese and having an inactive (sedentary) lifestyle can increase your risk for diabetes. Type 2 diabetes can be delayed or prevented by making certain nutrition and lifestyle changes. What nutrition changes can be made?   Eat healthy meals and snacks regularly. Keep a healthy snack with you for when you get hungry between meals, such as fruit or a handful of nuts.  Eat lean meats and proteins that are low in saturated fats, such as chicken, fish, egg whites, and beans. Avoid processed meats.  Eat plenty of fruits and vegetables and plenty of grains that have not been processed (whole grains). It is recommended that you eat: ? 1?2 cups of fruit every day. ? 2?3 cups of vegetables every day. ? 6?8 oz of whole grains every day, such as oats, whole wheat, bulgur, brown rice, quinoa, and millet.  Eat low-fat dairy products, such as milk, yogurt, and cheese.  Eat foods that contain healthy fats, such as nuts, avocado, olive oil, and canola oil.  Drink water throughout the day. Avoid drinks that contain added sugar, such as soda or sweet tea.  Follow instructions from your health care provider about specific eating or drinking restrictions.  Control how much food you eat at a time  (portion size). ? Check food labels to find out the serving sizes of foods. ? Use a kitchen scale to weigh amounts of foods.  Saute or steam food instead of frying it. Cook with water or broth instead of oils or butter.  Limit your intake of: ? Salt (sodium). Have no more than 1 tsp (2,400 mg) of sodium a day. If you have heart disease or high blood pressure, have less than ? tsp (1,500 mg) of sodium a day. ? Saturated fat. This is fat that is solid at room temperature, such as butter or fat on meat. What lifestyle changes can be made? Activity   Do moderate-intensity physical activity for at least 30 minutes on at least 5 days of the week, or as much as told by your health care provider.  Ask your health care provider what activities are safe for you. A mix of physical activities may be best, such as walking, swimming, cycling, and strength training.  Try to add physical activity into your day. For example: ? Park in spots that are farther away than usual, so that you walk more. For example, park in a far corner of the parking lot when you go to the office or the grocery store. ? Take a walk during your lunch break. ? Use stairs instead of elevators or escalators. Weight Loss  Lose weight as directed. Your health care provider can determine how much weight loss is best for you and can help you lose weight safely.  If you are overweight or obese,  you may be instructed to lose at least 5?7 % of your body weight. Alcohol and Tobacco   Limit alcohol intake to no more than 1 drink a day for nonpregnant women and 2 drinks a day for men. One drink equals 12 oz of beer, 5 oz of wine, or 1 oz of hard liquor.  Do not use any tobacco products, such as cigarettes, chewing tobacco, and e-cigarettes. If you need help quitting, ask your health care provider. Work With Your Health Care Provider  Have your blood glucose tested regularly, as told by your health care provider.  Discuss your risk  factors and how you can reduce your risk for diabetes.  Get screening tests as told by your health care provider. You may have screening tests regularly, especially if you have certain risk factors for type 2 diabetes.  Make an appointment with a diet and nutrition specialist (registered dietitian). A registered dietitian can help you make a healthy eating plan and can help you understand portion sizes and food labels. Why are these changes important?  It is possible to prevent or delay type 2 diabetes and related health problems by making lifestyle and nutrition changes.  It can be difficult to recognize signs of type 2 diabetes. The best way to avoid possible damage to your body is to take actions to prevent the disease before you develop symptoms. What can happen if changes are not made?  Your blood glucose levels may keep increasing. Having high blood glucose for a long time is dangerous. Too much glucose in your blood can damage your blood vessels, heart, kidneys, nerves, and eyes.  You may develop prediabetes or type 2 diabetes. Type 2 diabetes can lead to many chronic health problems and complications, such as: ? Heart disease. ? Stroke. ? Blindness. ? Kidney disease. ? Depression. ? Poor circulation in the feet and legs, which could lead to surgical removal (amputation) in severe cases. Where to find support  Ask your health care provider to recommend a registered dietitian, diabetes educator, or weight loss program.  Look for local or online weight loss groups.  Join a gym, fitness club, or outdoor activity group, such as a walking club. Where to find more information To learn more about diabetes and diabetes prevention, visit:  American Diabetes Association (ADA): www.diabetes.[employer] of Diabetes and Digestive and Kidney Diseases: ToyArticles.ca To learn more about healthy eating, visit:  The U.S. Department of Agriculture  Architect), Choose My Plate: http://yates.biz/  Office of Disease Prevention and Health Promotion (ODPHP), Dietary Guidelines: ListingMagazine.si Summary  You can reduce your risk for type 2 diabetes by increasing your physical activity, eating healthy foods, and losing weight as directed.  Talk with your health care provider about your risk for type 2 diabetes. Ask about any blood tests or screening tests that you need to have. This information is not intended to replace advice given to you by your health care provider. Make sure you discuss any questions you have with your health care provider. Document Revised: 05/04/2018 Document Reviewed: 03/03/2015 Elsevier Patient Education  2020 [employer].

## 2019-12-11 ENCOUNTER — Encounter: Payer: Self-pay | Admitting: Neurology

## 2019-12-11 ENCOUNTER — Ambulatory Visit: Payer: Managed Care, Other (non HMO) | Admitting: Neurology

## 2019-12-11 VITALS — BP 119/69 | HR 77 | Ht 64.1 in | Wt 145.5 lb

## 2019-12-11 DIAGNOSIS — G35 Multiple sclerosis: Secondary | ICD-10-CM | POA: Diagnosis not present

## 2019-12-11 DIAGNOSIS — Z8669 Personal history of other diseases of the nervous system and sense organs: Secondary | ICD-10-CM | POA: Diagnosis not present

## 2019-12-11 DIAGNOSIS — R200 Anesthesia of skin: Secondary | ICD-10-CM | POA: Diagnosis not present

## 2019-12-11 DIAGNOSIS — Z79899 Other long term (current) drug therapy: Secondary | ICD-10-CM | POA: Diagnosis not present

## 2019-12-11 NOTE — Progress Notes (Signed)
[facility]  PATIENT: Taylor Clements DOB: July 18, 1970  REFERRING DOCTOR OR PCP:  Felix Pacini SOURCE: patient, notes from Dr. Claiborne Billings,   _________________________________   HISTORICAL  CHIEF COMPLAINT:  She is a 49 y.o. woman with relapsing remitting multiple sclerosis diagnosed in 2005  Update 12/11/2019: She feels her MS is stable.   She had her first year of Mavenclad in May 18-22 and Jul 08, 2019.   She had headaches a few days with the first 5 days and milder with the second course.  She otherwise tolerated it well and has no s.e. currently.     She denies any problems with gait, balance, strength and sensation.  She can go > 3 miles without stopping.   She needs to hold the bannister going downstairs but not upstairs.      Bladder function is fine.   Vision is fine (needs readers and gets some eye strain though).     She denies fatigue and sleeps well most nights.   Mood is good.   She denies cognitive issues.     MS History:  She was diagnosed with MS in late 2005.  She first presented with right optic neuritis that year.   She was treated with several days of IV Solu-Medrol and vision improved.   An MRI was consistent with MS.   She was initially placed on Avonex late 2005 or early 2006.  Around 2014, after Plegridy was approved, she switched from Avonex to Plegridy.   She has no definite exacerbation.  However, about 5 years ago she began to notes spasticity in her right arm and more fatigue.     She was placed on baclofen but only took it about a month or less.    The spasticity improved.     She has not been on any treatment for her fatigue.   She had her 1st year Mavenclad May 18/Jul 08 2019.    Imaging studies: MRI of the brain performed in 2018.  It shows 6 or 7 small T2/flair hyperintense foci, 2 in the periventricular white matter and the rest in the subcortical and deep white matter.  None of the foci appear to be acute.  There was no atrophy.   There are no lesions in the infratentorial white matter.  None of the foci enhance.  MRI 04/17/19 showed scattered T2/FLAIR hyperintense foci in the periventricular, juxtacortical and deep white matter of the hemispheres in a pattern and configuration compatible with chronic demyelinating plaque associated with multiple sclerosis.  None the foci appear to be acute.  They do not enhance.  Compared to the MRI dated 07/10/2017, there are no new lesions.  She has no FH of MS or other autoimmune disorder.        REVIEW OF SYSTEMS: Constitutional: No fevers, chills, sweats, or change in appetite.  She has fatigue.    Sleeps well Eyes: No visual changes, double vision, eye pain Ear, nose and throat: No hearing loss, ear pain, nasal congestion, sore throat Cardiovascular: No chest pain, palpitations Respiratory: No shortness of breath at rest or with exertion.   No wheezes GastrointestinaI: No nausea, vomiting, abdominal pain, fecal incontinence.  Occ diarrhea, maybe IBS.  Genitourinary: No dysuria,.  No nocturia.  Some urgency Musculoskeletal: No neck pain, back pain Integumentary: No rash, pruritus, skin lesions Neurological: as above Psychiatric: No depression at this time.  No anxiety Endocrine: No palpitations, diaphoresis, change in appetite, change in weigh or increased thirst Hematologic/Lymphatic:  No anemia, purpura, petechiae. Allergic/Immunologic: No itchy/runny eyes, nasal congestion, recent allergic reactions, rashes  ALLERGIES: No Known Allergies  HOME MEDICATIONS:  Current Outpatient Medications:  .  Calcium 500-125 MG-UNIT TABS, Take 2 tablets by mouth daily., Disp: , Rfl:  .  Cladribine, 7 Tabs, (MAVENCLAD, 7 TABS,) 10 MG TBPK, Take by mouth. Year 1 Month 1 and 2: Days 1-2: 2 tabs per day and Days 3-5: 1 tab per day to equal 7 tabs total, Disp: , Rfl:  .  hyoscyamine (LEVSIN SL) 0.125 MG SL tablet, Place 1 tablet (0.125 mg total) under the tongue every 4 (four) hours as  needed., Disp: 90 tablet, Rfl: 1 .  LORazepam (ATIVAN) 1 MG tablet, Take 1 tablet (1 mg total) by mouth every 12 (twelve) hours as needed for anxiety., Disp: 10 tablet, Rfl: 0 .  Multiple Vitamin (MULTIVITAMIN) capsule, Take 1 capsule by mouth daily., Disp: , Rfl:  .  Olopatadine HCl 0.2 % SOLN, Apply 1 drop to eye daily., Disp: , Rfl:  .  Omega-3 Fatty Acids (FISH OIL) 1200 MG CAPS, Take 1 capsule by mouth 2 (two) times daily., Disp: , Rfl:   PAST MEDICAL HISTORY: Past Medical History:  Diagnosis Date  . Actinic keratitis 12/07/2016   shaved, deep margins- pt declined further biospy, aldara and  Excel V laser completed by derm  . Allergy   . Chicken pox   . Idiopathic chronic venous hypertension of both legs with inflammation   . Menorrhagia    control on BCP  . Multiple sclerosis (HCC) 2005   diagnosed 2005; after optic Neuritis. Has IBS, fatiue and right arm paresthesia w/ flares  . Optic neuritis due to multiple sclerosis (HCC) 2007   loss vision; IV steroids --> vision returned.   . Phobia, flying    uses ativan with flights  . Post partum depression   . Venous insufficiency (chronic) (peripheral)     PAST SURGICAL HISTORY: Past Surgical History:  Procedure Laterality Date  . DILATION AND EVACUATION  2003   nonviable pregnancy  . image: duplex LLE Left 05/16/2016   scanned into media.   . Image: MRI Brain  07/10/2017   Stable minor subcortical & periventricular white matter changes; MS  . LASER ABLATION Left 2014   left greater saphenous vein, then endoevous chemical ablation and sclerotherpay.   . procedure: Excel V laser  12/07/2016   Ak tip of nose shaved-deep margins-pt declined deeper bx, aldara cream and laser completed by derm.     FAMILY HISTORY: Family History  Problem Relation Age of Onset  . Breast cancer Mother   . Asthma Brother   . Depression Brother   . Drug abuse Brother   . Learning disabilities Brother   . Stroke Maternal Grandmother      SOCIAL HISTORY:  Social History   Socioeconomic History  . Marital status: Married    Spouse name: Aurther Loft  . Number of children: 1  . Years of education: BA  . Highest education level: Not on file  Occupational History  . Occupation: n/a  Tobacco Use  . Smoking status: Never Smoker  . Smokeless tobacco: Never Used  Vaping Use  . Vaping Use: Never used  Substance and Sexual Activity  . Alcohol use: Not Currently  . Drug use: Never  . Sexual activity: Yes    Partners: Male  Other Topics Concern  . Not on file  Social History Narrative   Marital status/children/pets: married, 1 child   Safety:      -  smoke alarm in the home:Yes     - wears seatbelt: Yes     - Feels safe in their relationships: Yes      Right handed    Caffeine use: tea daily   Social Determinants of Health   Financial Resource Strain:   . Difficulty of Paying Living Expenses: Not on file  Food Insecurity:   . Worried About Programme researcher, broadcasting/film/video in the Last Year: Not on file  . Ran Out of Food in the Last Year: Not on file  Transportation Needs:   . Lack of Transportation (Medical): Not on file  . Lack of Transportation (Non-Medical): Not on file  Physical Activity:   . Days of Exercise per Week: Not on file  . Minutes of Exercise per Session: Not on file  Stress:   . Feeling of Stress : Not on file  Social Connections:   . Frequency of Communication with Friends and Family: Not on file  . Frequency of Social Gatherings with Friends and Family: Not on file  . Attends Religious Services: Not on file  . Active Member of Clubs or Organizations: Not on file  . Attends Banker Meetings: Not on file  . Marital Status: Not on file  Intimate Partner Violence:   . Fear of Current or Ex-Partner: Not on file  . Emotionally Abused: Not on file  . Physically Abused: Not on file  . Sexually Abused: Not on file     PHYSICAL EXAM (from 03/27/2018 initial consult)  Vitals:   12/11/19 1245   BP: 119/69  Pulse: 77  Weight: 145 lb 8 oz (66 kg)  Height: 5' 4.1" (1.628 m)    Body mass index is 24.9 kg/m.   General: The patient is well-developed and well-nourished and in no acute distress  Skin: Extremities are without significant edema.  Neurologic Exam  Mental status: The patient is alert and oriented x 3 at the time of the examination. The patient has apparent normal recent and remote memory, with an apparently normal attention span and concentration ability.   Speech is normal.  Cranial nerves: Extraocular movements are full.. There is good facial sensation to soft touch bilaterally.Facial strength is normal.  Trapezius and sternocleidomastoid strength is normal. No dysarthria is noted. No obvious hearing deficits are noted.  Motor:  Muscle bulk is normal.   Tone is normal. Strength is  5 / 5 in all 4 extremities.   Sensory: Sensory testing is intact to pinprick, soft touch and vibration sensation in all 4 extremities.  Coordination: Cerebellar testing reveals good finger-nose-finger and heel-to-shin bilaterally.  Gait and station: Station is normal.    Gait is normal.  Tandem gait is minimally wide. .. Romberg is negative.   Reflexes: Deep tendon reflexes are symmetric and normal bilaterally.      Multiple sclerosis (HCC) - Plan: CBC with Differential/Platelet, Hepatic function panel  Numbness  History of optic neuritis  High risk medication use   1.   She tolerated Mavenclad.  We will check blood work for CBC with differential and CMP. 2.   Stay active and exercise as tolerated. 3.   She will return to see me in 5 months, depending on which treatment she begins.  Research data: EDSS = 1.0 due to 1 on cerebellar subscales all others are 0.  Amb Index = 0   25' walk = 4.7 / 4.9 (average 4.8)  Nuala Chiles A. Epimenio Foot, MD, [facility] 12/11/2019, 1:18 PM Certified in Neurology, Clinical  Neurophysiology, Sleep Medicine, Pain Medicine and Neuroimaging  [facility] [address], [address] [place], [place] [address] [phone]

## 2019-12-12 LAB — HEPATIC FUNCTION PANEL
ALT: 12 IU/L (ref 0–32)
AST: 18 IU/L (ref 0–40)
Albumin: 4.4 g/dL (ref 3.8–4.8)
Alkaline Phosphatase: 75 IU/L (ref 44–121)
Bilirubin Total: 0.2 mg/dL (ref 0.0–1.2)
Bilirubin, Direct: <0.1 mg/dL (ref 0.00–0.40)
Total Protein: 7.4 g/dL (ref 6.0–8.5)

## 2019-12-12 LAB — CBC WITH DIFFERENTIAL/PLATELET
Basophils Absolute: 0 10*3/uL (ref 0.0–0.2)
Basos: 1 %
EOS (ABSOLUTE): 0.1 10*3/uL (ref 0.0–0.4)
Eos: 2 %
Hematocrit: 36.3 % (ref 34.0–46.6)
Hemoglobin: 12.1 g/dL (ref 11.1–15.9)
Immature Grans (Abs): 0 10*3/uL (ref 0.0–0.1)
Immature Granulocytes: 0 %
Lymphocytes Absolute: 1.7 10*3/uL (ref 0.7–3.1)
Lymphs: 35 %
MCH: 27.7 pg (ref 26.6–33.0)
MCHC: 33.3 g/dL (ref 31.5–35.7)
MCV: 83 fL (ref 79–97)
Monocytes Absolute: 0.3 10*3/uL (ref 0.1–0.9)
Monocytes: 6 %
Neutrophils Absolute: 2.7 10*3/uL (ref 1.4–7.0)
Neutrophils: 56 %
Platelets: 295 10*3/uL (ref 150–450)
RBC: 4.37 x10E6/uL (ref 3.77–5.28)
RDW: 13.3 % (ref 11.7–15.4)
WBC: 4.8 10*3/uL (ref 3.4–10.8)

## 2020-04-28 ENCOUNTER — Encounter: Payer: Self-pay | Admitting: Family Medicine

## 2020-04-28 ENCOUNTER — Other Ambulatory Visit: Payer: Self-pay

## 2020-04-28 ENCOUNTER — Ambulatory Visit (INDEPENDENT_AMBULATORY_CARE_PROVIDER_SITE_OTHER): Payer: Managed Care, Other (non HMO) | Admitting: Family Medicine

## 2020-04-28 VITALS — BP 101/69 | HR 70 | Temp 97.8°F | Ht 64.0 in | Wt 148.0 lb

## 2020-04-28 DIAGNOSIS — Z0000 Encounter for general adult medical examination without abnormal findings: Secondary | ICD-10-CM | POA: Diagnosis not present

## 2020-04-28 DIAGNOSIS — E781 Pure hyperglyceridemia: Secondary | ICD-10-CM | POA: Diagnosis not present

## 2020-04-28 DIAGNOSIS — Z1159 Encounter for screening for other viral diseases: Secondary | ICD-10-CM

## 2020-04-28 DIAGNOSIS — G2581 Restless legs syndrome: Secondary | ICD-10-CM

## 2020-04-28 DIAGNOSIS — Z1211 Encounter for screening for malignant neoplasm of colon: Secondary | ICD-10-CM | POA: Diagnosis not present

## 2020-04-28 DIAGNOSIS — Z1231 Encounter for screening mammogram for malignant neoplasm of breast: Secondary | ICD-10-CM

## 2020-04-28 DIAGNOSIS — Z131 Encounter for screening for diabetes mellitus: Secondary | ICD-10-CM

## 2020-04-28 DIAGNOSIS — G35 Multiple sclerosis: Secondary | ICD-10-CM

## 2020-04-28 DIAGNOSIS — Z79899 Other long term (current) drug therapy: Secondary | ICD-10-CM | POA: Diagnosis not present

## 2020-04-28 DIAGNOSIS — L659 Nonscarring hair loss, unspecified: Secondary | ICD-10-CM | POA: Diagnosis not present

## 2020-04-28 DIAGNOSIS — N912 Amenorrhea, unspecified: Secondary | ICD-10-CM | POA: Diagnosis not present

## 2020-04-28 LAB — LIPID PANEL
Cholesterol: 192 mg/dL (ref 0–200)
HDL: 78.9 mg/dL (ref >39.00)
LDL Cholesterol: 94 mg/dL (ref 0–99)
NonHDL: 113.06
Total CHOL/HDL Ratio: 2
Triglycerides: 96 mg/dL (ref 0.0–149.0)
VLDL: 19.2 mg/dL (ref 0.0–40.0)

## 2020-04-28 LAB — COMPREHENSIVE METABOLIC PANEL
ALT: 12 U/L (ref 0–35)
AST: 16 U/L (ref 0–37)
Albumin: 4.5 g/dL (ref 3.5–5.2)
Alkaline Phosphatase: 64 U/L (ref 39–117)
BUN: 11 mg/dL (ref 6–23)
CO2: 27 mEq/L (ref 19–32)
Calcium: 9.9 mg/dL (ref 8.4–10.5)
Chloride: 103 mEq/L (ref 96–112)
Creatinine, Ser: 0.68 mg/dL (ref 0.40–1.20)
GFR: 101.95 mL/min (ref >60.00)
Glucose, Bld: 86 mg/dL (ref 70–99)
Potassium: 4 mEq/L (ref 3.5–5.1)
Sodium: 138 mEq/L (ref 135–145)
Total Bilirubin: 0.4 mg/dL (ref 0.2–1.2)
Total Protein: 7.6 g/dL (ref 6.0–8.3)

## 2020-04-28 LAB — CBC WITH DIFFERENTIAL/PLATELET
Basophils Absolute: 0 10*3/uL (ref 0.0–0.1)
Basophils Relative: 0.6 % (ref 0.0–3.0)
Eosinophils Absolute: 0.1 10*3/uL (ref 0.0–0.7)
Eosinophils Relative: 1.6 % (ref 0.0–5.0)
HCT: 35.8 % — ABNORMAL LOW (ref 36.0–46.0)
Hemoglobin: 11.8 g/dL — ABNORMAL LOW (ref 12.0–15.0)
Lymphocytes Relative: 36.4 % (ref 12.0–46.0)
Lymphs Abs: 1.5 10*3/uL (ref 0.7–4.0)
MCHC: 33.1 g/dL (ref 30.0–36.0)
MCV: 81.8 fl (ref 78.0–100.0)
Monocytes Absolute: 0.3 10*3/uL (ref 0.1–1.0)
Monocytes Relative: 6.2 % (ref 3.0–12.0)
Neutro Abs: 2.2 10*3/uL (ref 1.4–7.7)
Neutrophils Relative %: 55.2 % (ref 43.0–77.0)
Platelets: 279 10*3/uL (ref 150.0–400.0)
RBC: 4.37 Mil/uL (ref 3.87–5.11)
RDW: 14.8 % (ref 11.5–15.5)
WBC: 4 10*3/uL (ref 4.0–10.5)

## 2020-04-28 LAB — POCT URINE PREGNANCY: Preg Test, Ur: NEGATIVE

## 2020-04-28 LAB — HEMOGLOBIN A1C: Hgb A1c MFr Bld: 5.8 % (ref 4.6–6.5)

## 2020-04-28 LAB — VITAMIN D 25 HYDROXY (VIT D DEFICIENCY, FRACTURES): VITD: 41.19 ng/mL (ref 30.00–100.00)

## 2020-04-28 LAB — TSH: TSH: 1.13 u[IU]/mL (ref 0.35–4.50)

## 2020-04-28 NOTE — Progress Notes (Signed)
This visit occurred during the SARS-CoV-2 public health emergency.  Safety protocols were in place, including screening questions prior to the visit, additional usage of staff PPE, and extensive cleaning of exam room while observing appropriate contact time as indicated for disinfecting solutions.    Patient ID: Taylor Clements, female  DOB: [birth year], 50 y.o.   MRN: [id number] Patient Care Team    Relationship Specialty Notifications Start End  Natalia Leatherwood, DO PCP - General Family Medicine  11/21/17     Chief Complaint  Patient presents with  . Annual Exam    Pt is fasting; does not have an OBGYN; tdap 2015 per last CPE note;     Subjective: Taylor Clements is a 50 y.o.  Female  present for CPE. All past medical history, surgical history, allergies, family history, immunizations, medications and social history were updated in the electronic medical record today. All recent labs, ED visits and hospitalizations within the last year were reviewed.  Well women:  Patient's last menstrual period was 03/24/2020. 23-28d cycle, with 5 bleeding days. This month cycle is 36 days- which is odd for her.  He denies breast tenderness or nausea.  She denies vaginal dryness or hot flashes.  She is sexually active with her husband.  Health maintenance:  Colonoscopy:No fhx; routine screen 45 Mammogram: completed:FHX mother, completed 03/2019, birads1. @ [place]> order placed Cervical cancer screening: last pap:12/27/2017- Nl and neg co test- by Dr. Claiborne Billings. 5 yr follow up.  Immunizations: tdap2015, Influenzacompleted 2020(encouraged yearly). Covid x2- booster counseled.    Discussed Shingrix start after 50 which is the end of the May for her.  If she decides she would like to have that she may have nurse appointment to complete series. Infectious disease screening: HIV completed, Hep C completed today DEXA:routine screen, no chronic steroid with MS Assistive device: none Oxygen  AYT:KZSW Patient has a Dental home. Hospitalizations/ED visits: reviewed  Depression screen [facility] 2/9 04/28/2020 03/12/2019 12/27/2017 11/21/2017  Decreased Interest 0 0 0 0  Down, Depressed, Hopeless 0 0 0 0  PHQ - 2 Score 0 0 0 0  Altered sleeping - - 0 -  Tired, decreased energy - - 1 -  Change in appetite - - 0 -  Feeling bad or failure about yourself  - - 0 -  Trouble concentrating - - 0 -  Moving slowly or fidgety/restless - - 0 -  Suicidal thoughts - - 0 -  PHQ-9 Score - - 1 -  Difficult doing work/chores - - Not difficult at all -   GAD 7 : Generalized Anxiety Score 12/27/2017  Nervous, Anxious, on Edge 0  Control/stop worrying 0  Worry too much - different things 0  Trouble relaxing 0  Restless 0  Easily annoyed or irritable 1  Afraid - awful might happen 0  Total GAD 7 Score 1  Anxiety Difficulty Not difficult at all    Immunization History  Administered Date(s) Administered  . Influenza,inj,Quad PF,6+ Mos 12/27/2017  . Influenza-Unspecified 10/25/2018  . PFIZER(Purple Top)SARS-COV-2 Vaccination 04/13/2019, 05/04/2019   Past Medical History:  Diagnosis Date  . Actinic keratitis 12/07/2016   shaved, deep margins- pt declined further biospy, aldara and  Excel V laser completed by derm  . Allergy   . Chicken pox   . Idiopathic chronic venous hypertension of both legs with inflammation   . Menorrhagia    control on BCP  . Multiple sclerosis (HCC) 2005   diagnosed 2005; after optic Neuritis.  Has IBS, fatiue and right arm paresthesia w/ flares  . Optic neuritis due to multiple sclerosis (HCC) 2007   loss vision; IV steroids --> vision returned.   . Phobia, flying    uses ativan with flights  . Post partum depression   . Venous insufficiency (chronic) (peripheral)    No Known Allergies Past Surgical History:  Procedure Laterality Date  . DILATION AND EVACUATION  2003   nonviable pregnancy  . image: duplex LLE Left 05/16/2016   scanned into media.   . Image:  MRI Brain  07/10/2017   Stable minor subcortical & periventricular white matter changes; MS  . LASER ABLATION Left 2014   left greater saphenous vein, then endoevous chemical ablation and sclerotherpay.   . procedure: Excel V laser  12/07/2016   Ak tip of nose shaved-deep margins-pt declined deeper bx, aldara cream and laser completed by derm.    Family History  Problem Relation Age of Onset  . Breast cancer Mother   . Asthma Brother   . Depression Brother   . Drug abuse Brother   . Learning disabilities Brother   . Stroke Maternal Grandmother    Social History   Social History Narrative   Marital status/children/pets: married, 1 child   Safety:      -smoke alarm in the home:Yes     - wears seatbelt: Yes     - Feels safe in their relationships: Yes      Right handed    Caffeine use: tea daily    Allergies as of 04/28/2020   No Known Allergies     Medication List       Accurate as of April 28, 2020  8:42 AM. If you have any questions, ask your nurse or doctor.        Calcium 500-125 MG-UNIT Tabs Take 2 tablets by mouth daily.   Fish Oil 1200 MG Caps Take 1 capsule by mouth 2 (two) times daily.   hyoscyamine 0.125 MG SL tablet Commonly known as: LEVSIN SL Place 1 tablet (0.125 mg total) under the tongue every 4 (four) hours as needed.   LORazepam 1 MG tablet Commonly known as: ATIVAN Take 1 tablet (1 mg total) by mouth every 12 (twelve) hours as needed for anxiety.   Mavenclad (7 Tabs) 10 MG Tbpk Generic drug: Cladribine (7 Tabs) Take by mouth. Year 1 Month 1 and 2: Days 1-2: 2 tabs per day and Days 3-5: 1 tab per day to equal 7 tabs total   multivitamin capsule Take 1 capsule by mouth daily.   Olopatadine HCl 0.2 % Soln Apply 1 drop to eye daily.       All past medical history, surgical history, allergies, family history, immunizations andmedications were updated in the EMR today and reviewed under the history and medication portions of their EMR.      No results found for this or any previous visit (from the past 2160 hour(s)).    ROS: 14 pt review of systems performed and negative (unless mentioned in an HPI)  Objective: BP 101/69   Pulse 70   Temp 97.8 F (36.6 C) (Oral)   Ht 5\' 4"  (1.626 m)   Wt 148 lb (67.1 kg)   LMP 03/24/2020   SpO2 100%   BMI 25.40 kg/m  Gen: Afebrile. No acute distress. Nontoxic in appearance, well-developed, well-nourished, very pleasant female HENT: AT. [place]. Bilateral TM visualized and normal in appearance, normal external auditory canal. MMM, no oral lesions, adequate dentition. Bilateral  nares within normal limits. Throat without erythema, ulcerations or exudates.  No cough on exam, no hoarseness on exam. Eyes:Pupils Equal Round Reactive to light, Extraocular movements intact,  Conjunctiva without redness, discharge or icterus. Neck/lymp/endocrine: Supple, no lymphadenopathy, no thyromegaly CV: RRR no murmur, no edema, +2/4 P posterior tibialis pulses.  Chest: CTAB, no wheeze, rhonchi or crackles.  Normal respiratory effort.  Good air movement. Abd: Soft.  Flat. NTND. BS present.  No masses palpated. No hepatosplenomegaly. No rebound tenderness or guarding. Skin: No rashes, purpura or petechiae. Warm and well-perfused. Skin intact. Neuro/Msk:  Normal gait. PERLA. EOMi. Alert. Oriented x3.  Cranial nerves II through XII intact. Muscle strength 5/5 upper/lower extremity. DTRs equal bilaterally. Psych: Normal affect, dress and demeanor. Normal speech. Normal thought content and judgment.   No exam data present  Assessment/plan: Shelina Alieah Brinton is a 50 y.o. female present for CPE Multiple sclerosis (HCC) Managed by neuro Hypertriglyceridemia - Comprehensive metabolic panel - Lipid panel - TSH High risk medication use - CBC with Differential/Platelet Colon cancer screening - Cologuard Need for hepatitis C screening test - Hepatitis C antibody Breast cancer screening by mammogram - MM 3D  SCREEN BREAST BILATERAL; Future Diabetes mellitus screening - Hemoglobin A1c  Amenorrhea - POCT urine pregnancy> negative May be perimenopausal.  Will await lab results to ensure thyroid is not causing her cycles to be changed.  Could consider gynecologic referral if necessary.  RLS (restless legs syndrome) - Iron, TIBC and Ferritin Panel  Hair thinning - Vitamin D (25 hydroxy)  Routine general medical examination at a health care facility Patient was encouraged to exercise greater than 150 minutes a week. Patient was encouraged to choose a diet filled with fresh fruits and vegetables, and lean meats. AVS provided to patient today for education/recommendation on gender specific health and safety maintenance. Colonoscopy:No fhx; routine screen 45 Mammogram: completed:FHX mother, completed 03/2019, birads1. @ [place]- [facility]> order placed Cervical cancer screening: last pap:12/27/2017- Nl and neg co test- by Dr. Claiborne Billings. 5 yr follow up.  Immunizations: tdap2015, Influenzacompleted 2020(encouraged yearly). Covid x2- booster counseled.    Discussed Shingrix start after 50 which is the end of the May for her.  If she decides she would like to have that she may have nurse appointment to complete series. Infectious disease screening: HIV completed, Hep C completed today DEXA:routine screen, no chronic steroid with MS  Return in about 1 year (around 04/28/2021) for CPE (30 min).  Orders Placed This Encounter  Procedures  . MM 3D SCREEN BREAST BILATERAL  . CBC with Differential/Platelet  . Comprehensive metabolic panel  . Hemoglobin A1c  . Lipid panel  . TSH  . Hepatitis C antibody  . Cologuard  . Iron, TIBC and Ferritin Panel  . Vitamin D (25 hydroxy)  . POCT urine pregnancy   No orders of the defined types were placed in this encounter.  Referral Orders  No referral(s) requested today     Electronically signed by: Felix Pacini, DO [facility]

## 2020-04-28 NOTE — Patient Instructions (Signed)
Health Maintenance, Female Adopting a healthy lifestyle and getting preventive care are important in promoting health and wellness. Ask your health care provider about:  The right schedule for you to have regular tests and exams.  Things you can do on your own to prevent diseases and keep yourself healthy. What should I know about diet, weight, and exercise? Eat a healthy diet  Eat a diet that includes plenty of vegetables, fruits, low-fat dairy products, and lean protein.  Do not eat a lot of foods that are high in solid fats, added sugars, or sodium.   Maintain a healthy weight Body mass index (BMI) is used to identify weight problems. It estimates body fat based on height and weight. Your health care provider can help determine your BMI and help you achieve or maintain a healthy weight. Get regular exercise Get regular exercise. This is one of the most important things you can do for your health. Most adults should:  Exercise for at least 150 minutes each week. The exercise should increase your heart rate and make you sweat (moderate-intensity exercise).  Do strengthening exercises at least twice a week. This is in addition to the moderate-intensity exercise.  Spend less time sitting. Even light physical activity can be beneficial. Watch cholesterol and blood lipids Have your blood tested for lipids and cholesterol at 50 years of age, then have this test every 5 years. Have your cholesterol levels checked more often if:  Your lipid or cholesterol levels are high.  You are older than 50 years of age.  You are at high risk for heart disease. What should I know about cancer screening? Depending on your health history and family history, you may need to have cancer screening at various ages. This may include screening for:  Breast cancer.  Cervical cancer.  Colorectal cancer.  Skin cancer.  Lung cancer. What should I know about heart disease, diabetes, and high blood  pressure? Blood pressure and heart disease  High blood pressure causes heart disease and increases the risk of stroke. This is more likely to develop in people who have high blood pressure readings, are of African descent, or are overweight.  Have your blood pressure checked: ? Every 3-5 years if you are 18-39 years of age. ? Every year if you are 40 years old or older. Diabetes Have regular diabetes screenings. This checks your fasting blood sugar level. Have the screening done:  Once every three years after age 40 if you are at a normal weight and have a low risk for diabetes.  More often and at a younger age if you are overweight or have a high risk for diabetes. What should I know about preventing infection? Hepatitis B If you have a higher risk for hepatitis B, you should be screened for this virus. Talk with your health care provider to find out if you are at risk for hepatitis B infection. Hepatitis C Testing is recommended for:  Everyone born from 1945 through 1965.  Anyone with known risk factors for hepatitis C. Sexually transmitted infections (STIs)  Get screened for STIs, including gonorrhea and chlamydia, if: ? You are sexually active and are younger than 50 years of age. ? You are older than 50 years of age and your health care provider tells you that you are at risk for this type of infection. ? Your sexual activity has changed since you were last screened, and you are at increased risk for chlamydia or gonorrhea. Ask your health care provider   if you are at risk.  Ask your health care provider about whether you are at high risk for HIV. Your health care provider may recommend a prescription medicine to help prevent HIV infection. If you choose to take medicine to prevent HIV, you should first get tested for HIV. You should then be tested every 3 months for as long as you are taking the medicine. Pregnancy  If you are about to stop having your period (premenopausal) and  you may become pregnant, seek counseling before you get pregnant.  Take 400 to 800 micrograms (mcg) of folic acid every day if you become pregnant.  Ask for birth control (contraception) if you want to prevent pregnancy. Osteoporosis and menopause Osteoporosis is a disease in which the bones lose minerals and strength with aging. This can result in bone fractures. If you are 65 years old or older, or if you are at risk for osteoporosis and fractures, ask your health care provider if you should:  Be screened for bone loss.  Take a calcium or vitamin D supplement to lower your risk of fractures.  Be given hormone replacement therapy (HRT) to treat symptoms of menopause. Follow these instructions at home: Lifestyle  Do not use any products that contain nicotine or tobacco, such as cigarettes, e-cigarettes, and chewing tobacco. If you need help quitting, ask your health care provider.  Do not use street drugs.  Do not share needles.  Ask your health care provider for help if you need support or information about quitting drugs. Alcohol use  Do not drink alcohol if: ? Your health care provider tells you not to drink. ? You are pregnant, may be pregnant, or are planning to become pregnant.  If you drink alcohol: ? Limit how much you use to 0-1 drink a day. ? Limit intake if you are breastfeeding.  Be aware of how much alcohol is in your drink. In the [place] one drink equals one 12 oz bottle of beer (355 mL), one 5 oz glass of wine (148 mL), or one 1 oz glass of hard liquor (44 mL). General instructions  Schedule regular health, dental, and eye exams.  Stay current with your vaccines.  Tell your health care provider if: ? You often feel depressed. ? You have ever been abused or do not feel safe at home. Summary  Adopting a healthy lifestyle and getting preventive care are important in promoting health and wellness.  Follow your health care provider's instructions about healthy  diet, exercising, and getting tested or screened for diseases.  Follow your health care provider's instructions on monitoring your cholesterol and blood pressure. This information is not intended to replace advice given to you by your health care provider. Make sure you discuss any questions you have with your health care provider. Document Revised: 01/03/2018 Document Reviewed: 01/03/2018 Elsevier Patient Education  2021 [employer]vier Inc.  

## 2020-04-29 LAB — IRON,TIBC AND FERRITIN PANEL
%SAT: 9 % (calc) — ABNORMAL LOW (ref 16–45)
Ferritin: 3 ng/mL — ABNORMAL LOW (ref 16–232)
Iron: 41 ug/dL (ref 40–190)
TIBC: 450 mcg/dL (calc) (ref 250–450)

## 2020-04-29 LAB — HEPATITIS C ANTIBODY
Hepatitis C Ab: NONREACTIVE
SIGNAL TO CUT-OFF: 0 (ref <1.00)

## 2020-04-30 ENCOUNTER — Telehealth: Payer: Self-pay | Admitting: Family Medicine

## 2020-04-30 MED ORDER — POLYSACCHARIDE-IRON COMPLEX 150 MG PO CAPS: ORAL_CAPSULE | ORAL | 3 refills | Status: DC

## 2020-04-30 NOTE — Telephone Encounter (Signed)
Pt notified and verbalized understanding.

## 2020-04-30 NOTE — Telephone Encounter (Signed)
Spoke with pt regarding labs and instructions.   

## 2020-04-30 NOTE — Telephone Encounter (Signed)
Please call patient Liver, kidney and thyroid function are normal Blood cell counts and electrolytes are normal Diabetes screening/A1c is normal but did go up from 5.2 to 5.8.  This is getting close to the prediabetic range.  Try to increase exercise and avoid consuming high amounts of sugar.   Cholesterol panel looks great and is at goal. Vitamin D levels look great at 41. Blood cell counts are just mildly abnormal with a hemoglobin of 11.8 , however this is not much of a change from her normal of 12.1 and not concerning at this level.  Since her thyroid panel is normal, is not the cause of her irregular menstrual cycle.  May be signs of early hormonal changes such as perimenopause.  Iron panel is abnormal.  Her iron saturations and iron stores are low.  She would benefit from an iron supplementation.  This may be some of the cause of her restless leg symptoms.  Increasing exercise will also be helpful.  I have called in a iron supplementation to be taken Monday, Wednesday, Friday (3 times a week).  If her insurance does not cover if this iron supplementation she can buy over-the-counter or even on Amazon.  Iron polysaccharide 150 mg .

## 2020-05-12 ENCOUNTER — Ambulatory Visit: Payer: Managed Care, Other (non HMO) | Admitting: Neurology

## 2020-05-12 ENCOUNTER — Encounter: Payer: Self-pay | Admitting: Neurology

## 2020-05-12 ENCOUNTER — Other Ambulatory Visit: Payer: Self-pay

## 2020-05-12 ENCOUNTER — Telehealth: Payer: Self-pay | Admitting: Neurology

## 2020-05-12 VITALS — BP 99/65 | HR 88 | Ht 64.0 in | Wt 149.5 lb

## 2020-05-12 DIAGNOSIS — Z79899 Other long term (current) drug therapy: Secondary | ICD-10-CM

## 2020-05-12 DIAGNOSIS — Z8669 Personal history of other diseases of the nervous system and sense organs: Secondary | ICD-10-CM | POA: Diagnosis not present

## 2020-05-12 DIAGNOSIS — G35 Multiple sclerosis: Secondary | ICD-10-CM | POA: Diagnosis not present

## 2020-05-12 NOTE — Progress Notes (Signed)
[facility]  PATIENT: Taylor Clements DOB: 06/23/1970  REFERRING DOCTOR OR PCP:  Felix Pacini SOURCE: patient, notes from Dr. Claiborne Billings,   _________________________________   HISTORICAL  CHIEF COMPLAINT:  She is a 50 y.o. woman with relapsing remitting multiple sclerosis diagnosed in 2005  Update 05/12/2020: She feels her MS is stable.   She will be having her second year of Mavenclad soon.  She had her first year of Mavenclad in May 18-22 and Jul 08, 2019.   She tolerated it fairly well but had headaches a few days with the first 5 days and milder with the second course for the first year.  She denies any problems with gait, balance, strength and sensation.  She cancan go > 3 miles without stopping.   She needs to hold the bannister going downstairs but not upstairs.      Bladder function is fine.   Vision is fine (needs readers and gets some eye strain though).     Notes fatigue some afternoons/evenings.   She sleeps well most nights.   Mood is good.   She denies cognitive issues.  She has mild anemia and her PCP checked labs showing very low ferritin of 3 ng/mL (16-232).       MS History:  She was diagnosed with MS in late 2005.  She first presented with right optic neuritis that year.   She was treated with several days of IV Solu-Medrol and vision improved.   An MRI was consistent with MS.   She was initially placed on Avonex late 2005 or early 2006.  Around 2014, after Plegridy was approved, she switched from Avonex to Plegridy.   She has no definite exacerbation.  However, about 5 years ago she began to notes spasticity in her right arm and more fatigue.     She was placed on baclofen but only took it about a month or less.    The spasticity improved.     She has not been on any treatment for her fatigue.   She had her 1st year Mavenclad May 18/Jul 08 2019.    Imaging studies: MRI of the brain performed in 2018.  It shows 6 or 7 small T2/flair hyperintense  foci, 2 in the periventricular white matter and the rest in the subcortical and deep white matter.  None of the foci appear to be acute.  There was no atrophy.  There are no lesions in the infratentorial white matter.  None of the foci enhance.  MRI 04/17/19 showed scattered T2/FLAIR hyperintense foci in the periventricular, juxtacortical and deep white matter of the hemispheres in a pattern and configuration compatible with chronic demyelinating plaque associated with multiple sclerosis.  None the foci appear to be acute.  They do not enhance.  Compared to the MRI dated 07/10/2017, there are no new lesions.  She has no FH of MS or other autoimmune disorder.        REVIEW OF SYSTEMS: Constitutional: No fevers, chills, sweats, or change in appetite.  She has fatigue.    Sleeps well Eyes: No visual changes, double vision, eye pain Ear, nose and throat: No hearing loss, ear pain, nasal congestion, sore throat Cardiovascular: No chest pain, palpitations Respiratory: No shortness of breath at rest or with exertion.   No wheezes GastrointestinaI: No nausea, vomiting, abdominal pain, fecal incontinence.  Occ diarrhea, maybe IBS.  Genitourinary: No dysuria,.  No nocturia.  Some urgency Musculoskeletal: No neck pain, back pain Integumentary: No rash,  pruritus, skin lesions Neurological: as above Psychiatric: No depression at this time.  No anxiety Endocrine: No palpitations, diaphoresis, change in appetite, change in weigh or increased thirst Hematologic/Lymphatic: No anemia, purpura, petechiae. Allergic/Immunologic: No itchy/runny eyes, nasal congestion, recent allergic reactions, rashes  ALLERGIES: No Known Allergies  HOME MEDICATIONS:  Current Outpatient Medications:  .  Calcium 500-125 MG-UNIT TABS, Take 2 tablets by mouth daily., Disp: , Rfl:  .  Cladribine, 7 Tabs, (MAVENCLAD, 7 TABS,) 10 MG TBPK, Take by mouth. Year 1 Month 1 and 2: Days 1-2: 2 tabs per day and Days 3-5: 1 tab per day to  equal 7 tabs total, Disp: , Rfl:  .  hyoscyamine (LEVSIN SL) 0.125 MG SL tablet, Place 1 tablet (0.125 mg total) under the tongue every 4 (four) hours as needed., Disp: 90 tablet, Rfl: 1 .  loratadine (CLARITIN) 10 MG tablet, Take 10 mg by mouth daily., Disp: , Rfl:  .  LORazepam (ATIVAN) 1 MG tablet, Take 1 tablet (1 mg total) by mouth every 12 (twelve) hours as needed for anxiety., Disp: 10 tablet, Rfl: 0 .  Multiple Vitamin (MULTIVITAMIN) capsule, Take 1 capsule by mouth daily., Disp: , Rfl:  .  Olopatadine HCl 0.2 % SOLN, Apply 1 drop to eye daily., Disp: , Rfl:  .  Omega-3 Fatty Acids (FISH OIL) 1200 MG CAPS, Take 1 capsule by mouth 2 (two) times daily., Disp: , Rfl:  .  Polysaccharide-Iron Complex 150 MG CAPS, 1 tab p.o. Monday, Wednesday and Friday., Disp: 36 capsule, Rfl: 3 .  Pseudoephedrine HCl (DECONGESTANT PO), Take by mouth., Disp: , Rfl:   PAST MEDICAL HISTORY: Past Medical History:  Diagnosis Date  . Actinic keratitis 12/07/2016   shaved, deep margins- pt declined further biospy, aldara and  Excel V laser completed by derm  . Allergy   . Chicken pox   . Idiopathic chronic venous hypertension of both legs with inflammation   . Menorrhagia    control on BCP  . Multiple sclerosis (HCC) 2005   diagnosed 2005; after optic Neuritis. Has IBS, fatiue and right arm paresthesia w/ flares  . Optic neuritis due to multiple sclerosis (HCC) 2007   loss vision; IV steroids --> vision returned.   . Phobia, flying    uses ativan with flights  . Post partum depression   . Venous insufficiency (chronic) (peripheral)     PAST SURGICAL HISTORY: Past Surgical History:  Procedure Laterality Date  . DILATION AND EVACUATION  2003   nonviable pregnancy  . image: duplex LLE Left 05/16/2016   scanned into media.   . Image: MRI Brain  07/10/2017   Stable minor subcortical & periventricular white matter changes; MS  . LASER ABLATION Left 2014   left greater saphenous vein, then endoevous  chemical ablation and sclerotherpay.   . procedure: Excel V laser  12/07/2016   Ak tip of nose shaved-deep margins-pt declined deeper bx, aldara cream and laser completed by derm.     FAMILY HISTORY: Family History  Problem Relation Age of Onset  . Breast cancer Mother   . Asthma Brother   . Depression Brother   . Drug abuse Brother   . Learning disabilities Brother   . Stroke Maternal Grandmother     SOCIAL HISTORY:  Social History   Socioeconomic History  . Marital status: Married    Spouse name: Aurther Loft  . Number of children: 1  . Years of education: BA  . Highest education level: Not on file  Occupational  History  . Occupation: n/a  Tobacco Use  . Smoking status: Never Smoker  . Smokeless tobacco: Never Used  Vaping Use  . Vaping Use: Never used  Substance and Sexual Activity  . Alcohol use: Not Currently  . Drug use: Never  . Sexual activity: Yes    Partners: Male  Other Topics Concern  . Not on file  Social History Narrative   Marital status/children/pets: married, 1 child   Safety:      -smoke alarm in the home:Yes     - wears seatbelt: Yes     - Feels safe in their relationships: Yes      Right handed    Caffeine use: tea daily   Social Determinants of Health   Financial Resource Strain: Not on file  Food Insecurity: Not on file  Transportation Needs: Not on file  Physical Activity: Not on file  Stress: Not on file  Social Connections: Not on file  Intimate Partner Violence: Not on file     PHYSICAL EXAM (from 03/27/2018 initial consult)  Vitals:   05/12/20 1255  BP: 99/65  Pulse: 88  Weight: 149 lb 8 oz (67.8 kg)  Height: 5\' 4"  (1.626 m)    Body mass index is 25.66 kg/m.  VA:   OD 20/25    OS 20/20  General: The patient is well-developed and well-nourished and in no acute distress  Skin: Extremities are without significant edema.  Neurologic Exam  Mental status: The patient is alert and oriented x 3 at the time of the  examination. The patient has apparent normal recent and remote memory, with an apparently normal attention span and concentration ability.   Speech is normal.  Cranial nerves: Extraocular movements are full.. There is good facial sensation to soft touch bilaterally.Facial strength is normal.  Trapezius and sternocleidomastoid strength is normal. No dysarthria is noted. No obvious hearing deficits are noted.  Motor:  Muscle bulk is normal.   Tone is normal. Strength is  5 / 5 in all 4 extremities.   Sensory: Sensory testing is intact to pinprick, soft touch and vibration sensation in all 4 extremities.  Coordination: Cerebellar testing reveals good finger-nose-finger and heel-to-shin bilaterally.  Gait and station: Station is normal.    Gait is normal.  Tandem gait is minimally wide.. Romberg is negative.   Reflexes: Deep tendon reflexes are symmetric and normal bilaterally.      Multiple sclerosis (HCC) - Plan: MR BRAIN W WO CONTRAST, Hepatitis B surface antigen, HIV Antibody (routine testing w rflx), QuantiFERON-TB Gold Plus, Hepatitis B surface antibody,qualitative, Hepatitis B core antibody, total  High risk medication use - Plan: Hepatitis B surface antigen, HIV Antibody (routine testing w rflx), QuantiFERON-TB Gold Plus, Hepatitis B surface antibody,qualitative, Hepatitis B core antibody, total  History of optic neuritis   1.   She tolerated Mavenclad well the first year.   We will check labs and MRi in preparation for the second year.  2.   Stay active and exercise as tolerated. 3.   She will return to see me in 5 months, depending on which treatment she begins.  Research data: EDSS = 1.0 due to 1 on visual subscales all others are 0.  Amb Index = 0   Shadoe Cryan A. , MD, [facility] 05/12/2020, 11:11 PM Certified in Neurology, Clinical Neurophysiology, Sleep Medicine, Pain Medicine and Neuroimaging  [facility] [address] [place], [place]  [place] [phone]

## 2020-05-12 NOTE — Telephone Encounter (Signed)
[employer] order sent to [facility]. They will obtain the auth and reach out to the patient to schedule.  °

## 2020-05-13 ENCOUNTER — Telehealth: Payer: Self-pay | Admitting: *Deleted

## 2020-05-13 NOTE — Telephone Encounter (Addendum)
Per Dr. Epimenio Foot from 05/12/2020, ok to send in 2nd year start form for [facility] if pt hep labs negative. Hep labs came back negative on 05/13/20.  Faxed completed/signed Mavenclad 2nd year start form to [employer] at [phone]. Received fax confirmation.  Weight: 145lb Month 1: Day 1: 2 tablets/day, Days 2-5: 1 tablet/day (total 6 tablets) Month 2: Day 1: 2 tablets/day, Days 2-5: 1 tablet/day (total 6 tablets)

## 2020-05-16 LAB — HEPATITIS B CORE ANTIBODY, TOTAL: Hep B Core Total Ab: NEGATIVE

## 2020-05-16 LAB — HIV ANTIBODY (ROUTINE TESTING W REFLEX): HIV Screen 4th Generation wRfx: NONREACTIVE

## 2020-05-16 LAB — QUANTIFERON-TB GOLD PLUS
QuantiFERON Mitogen Value: >10 IU/mL
QuantiFERON Nil Value: 0.12 IU/mL
QuantiFERON TB1 Ag Value: 0.1 IU/mL
QuantiFERON TB2 Ag Value: 0.09 IU/mL
QuantiFERON-TB Gold Plus: NEGATIVE

## 2020-05-16 LAB — HEPATITIS B SURFACE ANTIBODY,QUALITATIVE: Hep B Surface Ab, Qual: NONREACTIVE

## 2020-05-16 LAB — HEPATITIS B SURFACE ANTIGEN: Hepatitis B Surface Ag: NEGATIVE

## 2020-05-18 NOTE — Telephone Encounter (Signed)
Submitted PA on CMM. Key: [id number]. Received instant approval: CaseId:[id number];Status:Approved;Review Type:Prior Auth;Coverage Start Date:04/18/2020;Coverage End Date:05/18/2021;

## 2020-05-23 ENCOUNTER — Ambulatory Visit
Admission: RE | Admit: 2020-05-23 | Discharge: 2020-05-23 | Disposition: A | Discharge status: Home / Self Care | Payer: Managed Care, Other (non HMO) | Source: Ambulatory Visit | Attending: Neurology | Admitting: Neurology

## 2020-05-23 ENCOUNTER — Other Ambulatory Visit: Payer: Self-pay

## 2020-05-23 DIAGNOSIS — G35 Multiple sclerosis: Secondary | ICD-10-CM

## 2020-05-23 MED ORDER — GADOBENATE DIMEGLUMINE 529 MG/ML IV SOLN
13.0000 mL | Freq: Once | INTRAVENOUS | Status: AC | PRN
  Administered 2020-05-23: 13 mL via INTRAVENOUS

## 2020-05-25 NOTE — Telephone Encounter (Signed)
I called [employer].  The prescription for Mavenclad will come from [facility].  I called [facility].  They have trying to reach patient to schedule delivery of Mavenclad but patient has not returned her calls.  They will try again today.  I called patient.  She has not received these calls.  She will call them at [phone].  I will check on her next week to make sure unable to schedule delivery of Mavenclad.  Patient verbalized understanding.

## 2020-06-01 NOTE — Telephone Encounter (Signed)
I called [facility].  I spent 30 minutes on the phone.  Patient's account is locked until Jun 02, 2020.  It is unclear why.  They recommended that patient call tomorrow to try to schedule delivery of Mavenclad.  If after tomorrow there are further issues then the case will be escalated to manager.  I will respond to patient's MyChart message.

## 2020-07-23 ENCOUNTER — Other Ambulatory Visit: Payer: Self-pay

## 2020-07-23 ENCOUNTER — Ambulatory Visit
Admission: RE | Admit: 2020-07-23 | Discharge: 2020-07-23 | Disposition: A | Discharge status: Home / Self Care | Payer: Managed Care, Other (non HMO) | Source: Ambulatory Visit | Attending: Family Medicine | Admitting: Family Medicine

## 2020-07-23 DIAGNOSIS — Z1231 Encounter for screening mammogram for malignant neoplasm of breast: Secondary | ICD-10-CM

## 2020-07-28 ENCOUNTER — Encounter: Payer: Self-pay | Admitting: Neurology

## 2020-07-28 ENCOUNTER — Other Ambulatory Visit: Payer: Self-pay

## 2020-07-28 ENCOUNTER — Ambulatory Visit: Payer: Managed Care, Other (non HMO) | Admitting: Neurology

## 2020-07-28 VITALS — BP 113/71 | HR 78 | Ht 64.0 in | Wt 151.0 lb

## 2020-07-28 DIAGNOSIS — Z79899 Other long term (current) drug therapy: Secondary | ICD-10-CM

## 2020-07-28 DIAGNOSIS — G35 Multiple sclerosis: Secondary | ICD-10-CM | POA: Diagnosis not present

## 2020-07-28 DIAGNOSIS — Z8669 Personal history of other diseases of the nervous system and sense organs: Secondary | ICD-10-CM | POA: Diagnosis not present

## 2020-07-28 NOTE — Progress Notes (Signed)
[facility]  PATIENT: Taylor Clements DOB: [birth year]  REFERRING DOCTOR OR PCP:  Felix Pacini   _________________________________   HISTORICAL  CHIEF COMPLAINT:  She is a 50 y.o. woman with relapsing remitting multiple sclerosis diagnosed in 2005  Update 07/28/2020: She feels her MS is stable.   She did her second year of Mavenclad soon May 15-19 and June 12-16, 2022.  (She had her first year of Mavenclad in May 18-22 and Jul 08, 2019).   She had fatigue and headaches a few days after each cycle.  She is completely at baseline now.  Before she had these doses, she had an MRI of the brain 05/23/2020.  It showed no new lesions compared to 04/17/2019.  She denies significant difficulty with gait, balance, strength and sensation.  She is able to walk > 3 miles without stopping.   She sometimes feels she needs to hold the bannister going downstairs but not upstairs.      Bladder function is fine.   Vision is fine (needs readers and gets some eye strain though).     Notes fatigue some afternoons/evenings.   She sleeps well most nights.   Mood is good.   She denies cognitive issues.  She has mild anemia and her PCP checked labs showing very low ferritin of 3 ng/mL (16-232).       MS History:  She was diagnosed with MS in late 2005.  She first presented with right optic neuritis that year.   She was treated with several days of IV Solu-Medrol and vision improved.   An MRI was consistent with MS.   She was initially placed on Avonex late 2005 or early 2006.  Around 2014, after Plegridy was approved, she switched from Avonex to Plegridy.   She has no definite exacerbation.  Around 2013, she noted spasticity in her right arm and more fatigue.     She was placed on baclofen but only took it about a month or less.     She has not been on any treatment for her fatigue.   She had her 1st year Mavenclad May 18/Jul 08 2019.  She had the second year Mavenclad May/June 2022.     Imaging studies: MRI of the brain performed in 2018.  It shows 6 or 7 small T2/flair hyperintense foci, 2 in the periventricular white matter and the rest in the subcortical and deep white matter.  None of the foci appear to be acute.  There was no atrophy.  There are no lesions in the infratentorial white matter.  None of the foci enhance.  MRI 04/17/19 showed scattered T2/FLAIR hyperintense foci in the periventricular, juxtacortical and deep white matter of the hemispheres in a pattern and configuration compatible with chronic demyelinating plaque associated with multiple sclerosis.  None the foci appear to be acute.  They do not enhance.  Compared to the MRI dated 07/10/2017, there are no new lesions.  MRI 05/23/2020 shwoed T2/flair hyperintense foci in the hemispheres.  None of the foci enhance or appear to be acute.  They are consistent with chronic demyelinating plaque associated with multiple sclerosis.  Compared to the MRI from 04/17/2019, there are no new lesions.  She has no FH of MS or other autoimmune disorder.        REVIEW OF SYSTEMS: Constitutional: No fevers, chills, sweats, or change in appetite.  She has fatigue.    Sleeps well Eyes: No visual changes, double vision, eye pain Ear, nose and  throat: No hearing loss, ear pain, nasal congestion, sore throat Cardiovascular: No chest pain, palpitations Respiratory:  No shortness of breath at rest or with exertion.   No wheezes GastrointestinaI: No nausea, vomiting, abdominal pain, fecal incontinence.  Occ diarrhea, maybe IBS.  Genitourinary:  No dysuria,.  No nocturia.  Some urgency Musculoskeletal:  No neck pain, back pain Integumentary: No rash, pruritus, skin lesions Neurological: as above Psychiatric: No depression at this time.  No anxiety Endocrine: No palpitations, diaphoresis, change in appetite, change in weigh or increased thirst Hematologic/Lymphatic:  No anemia, purpura, petechiae. Allergic/Immunologic: No itchy/runny  eyes, nasal congestion, recent allergic reactions, rashes  ALLERGIES: No Known Allergies  HOME MEDICATIONS:  Current Outpatient Medications:    Calcium 500-125 MG-UNIT TABS, Take 2 tablets by mouth daily., Disp: , Rfl:    Cladribine, 7 Tabs, (MAVENCLAD, 7 TABS,) 10 MG TBPK, Take by mouth. Year 1 Month 1 and 2: Days 1-2: 2 tabs per day and Days 3-5: 1 tab per day to equal 7 tabs total, Disp: , Rfl:    hyoscyamine (LEVSIN SL) 0.125 MG SL tablet, Place 1 tablet (0.125 mg total) under the tongue every 4 (four) hours as needed., Disp: 90 tablet, Rfl: 1   loratadine (CLARITIN) 10 MG tablet, Take 10 mg by mouth daily., Disp: , Rfl:    LORazepam (ATIVAN) 1 MG tablet, Take 1 tablet (1 mg total) by mouth every 12 (twelve) hours as needed for anxiety., Disp: 10 tablet, Rfl: 0   Multiple Vitamin (MULTIVITAMIN) capsule, Take 1 capsule by mouth daily., Disp: , Rfl:    Olopatadine HCl 0.2 % SOLN, Apply 1 drop to eye daily., Disp: , Rfl:    Omega-3 Fatty Acids (FISH OIL) 1200 MG CAPS, Take 1 capsule by mouth 2 (two) times daily., Disp: , Rfl:    Polysaccharide-Iron Complex 150 MG CAPS, 1 tab p.o. Monday, Wednesday and Friday., Disp: 36 capsule, Rfl: 3   Pseudoephedrine HCl (DECONGESTANT PO), Take by mouth., Disp: , Rfl:   PAST MEDICAL HISTORY: Past Medical History:  Diagnosis Date   Actinic keratitis 12/07/2016   shaved, deep margins- pt declined further biospy, aldara and  Excel V laser completed by derm   Allergy    Chicken pox    Idiopathic chronic venous hypertension of both legs with inflammation    Menorrhagia    control on BCP   Multiple sclerosis (HCC) 2005   diagnosed 2005; after optic Neuritis. Has IBS, fatiue and right arm paresthesia w/ flares   Optic neuritis due to multiple sclerosis (HCC) 2007   loss vision; IV steroids --> vision returned.    Phobia, flying    uses ativan with flights   Post partum depression    Venous insufficiency (chronic) (peripheral)     PAST SURGICAL  HISTORY: Past Surgical History:  Procedure Laterality Date   DILATION AND EVACUATION  2003   nonviable pregnancy   image: duplex LLE Left 05/16/2016   scanned into media.    Image: MRI Brain  07/10/2017   Stable minor subcortical & periventricular white matter changes; MS   LASER ABLATION Left 2014   left greater saphenous vein, then endoevous chemical ablation and sclerotherpay.    procedure: Excel V laser  12/07/2016   Ak tip of nose shaved-deep margins-pt declined deeper bx, aldara cream and laser completed by derm.     FAMILY HISTORY: Family History  Problem Relation Age of Onset   Breast cancer Mother    Asthma Brother    Depression  Brother    Drug abuse Brother    Learning disabilities Brother    Stroke Maternal Grandmother     SOCIAL HISTORY:  Social History   Socioeconomic History   Marital status: Married    Spouse name: Aurther Loft   Number of children: 1   Years of education: BA   Highest education level: Not on file  Occupational History   Occupation: n/a  Tobacco Use   Smoking status: Never   Smokeless tobacco: Never  Vaping Use   Vaping Use: Never used  Substance and Sexual Activity   Alcohol use: Not Currently   Drug use: Never   Sexual activity: Yes    Partners: Male  Other Topics Concern   Not on file  Social History Narrative   Marital status/children/pets: married, 1 child   Safety:      -smoke alarm in the home:Yes     - wears seatbelt: Yes     - Feels safe in their relationships: Yes      Right handed    Caffeine use: tea daily   Social Determinants of Health   Financial Resource Strain: Not on file  Food Insecurity: Not on file  Transportation Needs: Not on file  Physical Activity: Not on file  Stress: Not on file  Social Connections: Not on file  Intimate Partner Violence: Not on file     PHYSICAL EXAM (from 03/27/2018 initial consult)  Vitals:   07/28/20 1251  BP: 113/71  Pulse: 78  Weight: 151 lb (68.5 kg)  Height: 5\' 4"   (1.626 m)    Body mass index is 25.92 kg/m.  VA:   OD 20/25    OS 20/20  General: The patient is well-developed and well-nourished and in no acute distress  Skin: Extremities are without significant edema.  Neurologic Exam  Mental status: The patient is alert and oriented x 3 at the time of the examination. The patient has apparent normal recent and remote memory, with an apparently normal attention span and concentration ability.   Speech is normal.  Cranial nerves: Extraocular movements are full.. There is good facial sensation to soft touch bilaterally.Facial strength is normal.  Trapezius and sternocleidomastoid strength is normal. No dysarthria is noted. No obvious hearing deficits are noted.  Motor:  Muscle bulk is normal.   Tone is normal. Strength is  5 / 5 in all 4 extremities.  Can rise up from a chair without using her arms.  Sensory: Sensory testing is intact to pinprick, soft touch and vibration sensation in all 4 extremities.  Coordination: Cerebellar testing reveals good finger-nose-finger and heel-to-shin bilaterally.  Gait and station: Station is normal.    The gait is normal.  Tandem gait is minimally wide... Romberg is negative.   Reflexes: Deep tendon reflexes are symmetric and normal bilaterally.      Multiple sclerosis (HCC)  High risk medication use  History of optic neuritis   1.   Her MS is doing well.  She has no exacerbations.  She has tolerated Mavenclad fairly well with just mild symptoms..   We will check labs today, again in 4 months.   2.   Stay active and exercise as tolerated. 3.   She will return to see me in 4 months, depending on which treatment she begins.  Research data: Today, she scored EDSS = 1.0 due to 1 on visual subscales all others are 0.  Amb Index = 0   Ples Trudel A. , MD, [facility] 07/28/2020, 1:20 PM  Certified in Neurology, Clinical Neurophysiology, Sleep Medicine, Pain Medicine and Neuroimaging  [facility] [address], [address] [place], [place] [phone]

## 2020-07-29 ENCOUNTER — Encounter: Payer: Self-pay | Admitting: Family Medicine

## 2020-07-29 LAB — CBC WITH DIFFERENTIAL/PLATELET
Basophils Absolute: 0 10*3/uL (ref 0.0–0.2)
Basos: 1 %
EOS (ABSOLUTE): 0.1 10*3/uL (ref 0.0–0.4)
Eos: 2 %
Hematocrit: 32 % — ABNORMAL LOW (ref 34.0–46.6)
Hemoglobin: 10.3 g/dL — ABNORMAL LOW (ref 11.1–15.9)
Immature Grans (Abs): 0 10*3/uL (ref 0.0–0.1)
Immature Granulocytes: 0 %
Lymphocytes Absolute: 1 10*3/uL (ref 0.7–3.1)
Lymphs: 26 %
MCH: 26.8 pg (ref 26.6–33.0)
MCHC: 32.2 g/dL (ref 31.5–35.7)
MCV: 83 fL (ref 79–97)
Monocytes Absolute: 0.4 10*3/uL (ref 0.1–0.9)
Monocytes: 11 %
Neutrophils Absolute: 2.3 10*3/uL (ref 1.4–7.0)
Neutrophils: 60 %
Platelets: 260 10*3/uL (ref 150–450)
RBC: 3.84 x10E6/uL (ref 3.77–5.28)
RDW: 14.6 % (ref 11.7–15.4)
WBC: 3.8 10*3/uL (ref 3.4–10.8)

## 2020-07-29 LAB — HEPATIC FUNCTION PANEL
ALT: 12 IU/L (ref 0–32)
AST: 17 IU/L (ref 0–40)
Albumin: 4.3 g/dL (ref 3.8–4.8)
Alkaline Phosphatase: 73 IU/L (ref 44–121)
Bilirubin Total: <0.2 mg/dL (ref 0.0–1.2)
Bilirubin, Direct: <0.1 mg/dL (ref 0.00–0.40)
Total Protein: 7.2 g/dL (ref 6.0–8.5)

## 2020-07-29 NOTE — Telephone Encounter (Signed)
Please inform Taylor Clements that I have reviewed her CBC collected by her neurologist and the hemoglobin is actually little lower than when we saw her.  If she does not already have a follow-up scheduled, I would encourage her to schedule an appointment for follow-up on her anemia and iron deficiency within the next 3-4 weeks. In the meantime continue the iron.

## 2020-08-21 ENCOUNTER — Ambulatory Visit: Payer: Managed Care, Other (non HMO) | Admitting: Family Medicine

## 2020-08-21 ENCOUNTER — Other Ambulatory Visit: Payer: Self-pay

## 2020-08-21 ENCOUNTER — Encounter: Payer: Self-pay | Admitting: Family Medicine

## 2020-08-21 VITALS — BP 97/64 | HR 89 | Temp 98.4°F | Ht 64.0 in | Wt 154.0 lb

## 2020-08-21 DIAGNOSIS — D509 Iron deficiency anemia, unspecified: Secondary | ICD-10-CM | POA: Diagnosis not present

## 2020-08-21 NOTE — Patient Instructions (Signed)
Iron Deficiency Anemia, Adult Iron deficiency anemia is when you do not have enough red blood cells or hemoglobin in your blood. This happens because you have too little iron in your body. Hemoglobin carries oxygen to parts of the body. Anemia can cause yourbody to not get enough oxygen. What are the causes? Not eating enough foods that have iron in them. The body not being able to take in iron well. Needing more iron due to pregnancy or heavy menstrual periods, for females. Cancer. Bleeding in the bowels. Many blood draws. What increases the risk? Being pregnant. Being a teenage girl going through a growth spurt. What are the signs or symptoms? Pale skin, lips, and nails. Weakness, dizziness, and getting tired easily. Headache. Feeling like you cannot breathe well when moving (shortness of breath). Cold hands and feet. Fast heartbeat or a heartbeat that is not regular. Feeling grouchy (irritable) or breathing fast. These are more common in very bad anemia. Mild anemia may not cause any symptoms. How is this treated? This condition is treated by finding out why you do not have enough iron and then getting more iron. It may include: Adding foods to your diet that have a lot of iron. Taking iron pills (supplements). If you are pregnant or breastfeeding, you may need to take extra iron. Your diet often does not provide the amount of iron that you need. Getting more vitamin C in your diet. Vitamin C helps your body take in iron. You may need to take iron pills with a glass of orange juice or vitamin C pills. Medicines to make heavy menstrual periods lighter. Surgery. You may need blood tests to see if treatment is working. If the treatment doesnot seem to be working, you may need more tests. Follow these instructions at home: Medicines Take over-the-counter and prescription medicines only as told by your doctor. This includes iron pills and vitamins. Take iron pills when your stomach is  empty. If you cannot handle this, take them with food. Do not drink milk or take antacids at the same time as your iron pills. Iron pills may turn your poop (stool)black. If you cannot handle taking iron pills by mouth, ask your doctor about getting iron through: An IV tube. A shot (injection) into a muscle. Eating and drinking  Talk with your doctor before changing the foods you eat. He or she may tell you to eat foods that have a lot of iron, such as: Liver. Low-fat (lean) beef. Breads and cereals that have iron added to them. Eggs. Dried fruit. Dark green, leafy vegetables. Eat fresh fruits and vegetables that are high in vitamin C. They help your body use iron. Foods with a lot of vitamin C include: Oranges. Peppers. Tomatoes. Mangoes. Drink enough fluid to keep your pee (urine) pale yellow.  Managing constipation If you are taking iron pills, they may cause trouble pooping (constipation). To prevent or treat trouble pooping, you may need to: Take over-the-counter or prescription medicines. Eat foods that are high in fiber. These include beans, whole grains, and fresh fruits and vegetables. Limit foods that are high in fat and sugar. These include fried or sweet foods. General instructions Return to your normal activities as told by your doctor. Ask your doctor what activities are safe for you. Keep yourself clean, and keep things clean around you. Keep all follow-up visits as told by your doctor. This is important. Contact a doctor if: You feel like you may vomit (nauseous), or you vomit. You feel   weak. You are sweating for no reason. You have trouble pooping, such as: Pooping less than 3 times a week. Straining to poop. Having poop that is hard, dry, or larger than normal. Feeling full or bloated. Pain in the lower belly. Not feeling better after pooping. Get help right away if: You pass out (faint). You have chest pain. You have trouble breathing that: Is very  bad. Gets worse with physical activity. You have a fast heartbeat, or a heartbeat that does not feel regular. You get light-headed when getting up from sitting or lying down. These symptoms may be an emergency. Do not wait to see if the symptoms will go away. Get medical help right away. Call your local emergency services (911 in the [place] Do not drive yourself to the hospital. Summary Iron deficiency anemia is when you have too little iron in your body. This condition is treated by finding out why you do not have enough iron in your body and then getting more iron. Take over-the-counter and prescription medicines only as told by your doctor. Eat fresh fruits and vegetables that are high in vitamin C. Get help right away if you cannot breathe well. This information is not intended to replace advice given to you by your health care provider. Make sure you discuss any questions you have with your healthcare provider. Document Revised: 09/18/2018 Document Reviewed: 09/18/2018 Elsevier Patient Education  2022 Elsevier Inc.  

## 2020-08-21 NOTE — Progress Notes (Signed)
This visit occurred during the SARS-CoV-2 public health emergency.  Safety protocols were in place, including screening questions prior to the visit, additional usage of staff PPE, and extensive cleaning of exam room while observing appropriate contact time as indicated for disinfecting solutions.    Taylor Clements , 19-Oct-1970, 51 y.o., female MRN: [id number] Patient Care Team    Relationship Specialty Notifications Start End  Natalia Leatherwood, DO PCP - General Family Medicine  11/21/17     Chief Complaint  Patient presents with   Anemia    Iron def      Subjective: Pt presents for an OV to follow up on iron deficiency anemia. Her hgb/Hct has slowly been trending down since November 2021. She was started on iron x3/w in April. Her CBC continued to trend down now hgb 10.3 and HCT 32. She has not been on BCP for > 2 yrs. Her cycles are heavier than when on BCP, but not heavy bleeding. Downward trend only started November. She has fatigue, but attributed that to her MS and cycles. She denies rectal bleeding. She has never had a colonoscopy. Cologuard ordered for her last year, but she decided to wait and do a colonoscopy.   Depression screen [facility] 2/9 04/28/2020 03/12/2019 12/27/2017 11/21/2017  Decreased Interest 0 0 0 0  Down, Depressed, Hopeless 0 0 0 0  PHQ - 2 Score 0 0 0 0  Altered sleeping - - 0 -  Tired, decreased energy - - 1 -  Change in appetite - - 0 -  Feeling bad or failure about yourself  - - 0 -  Trouble concentrating - - 0 -  Moving slowly or fidgety/restless - - 0 -  Suicidal thoughts - - 0 -  PHQ-9 Score - - 1 -  Difficult doing work/chores - - Not difficult at all -    No Known Allergies Social History   Social History Narrative   Marital status/children/pets: married, 1 child   Safety:      -smoke alarm in the home:Yes     - wears seatbelt: Yes     - Feels safe in their relationships: Yes      Right handed    Caffeine use: tea daily   Past Medical  History:  Diagnosis Date   Actinic keratitis 12/07/2016   shaved, deep margins- pt declined further biospy, aldara and  Excel V laser completed by derm   Allergy    Chicken pox    Idiopathic chronic venous hypertension of both legs with inflammation    Menorrhagia    control on BCP   Multiple sclerosis (HCC) 2005   diagnosed 2005; after optic Neuritis. Has IBS, fatiue and right arm paresthesia w/ flares   Optic neuritis due to multiple sclerosis (HCC) 2007   loss vision; IV steroids --> vision returned.    Phobia, flying    uses ativan with flights   Post partum depression    Venous insufficiency (chronic) (peripheral)    Past Surgical History:  Procedure Laterality Date   DILATION AND EVACUATION  2003   nonviable pregnancy   image: duplex LLE Left 05/16/2016   scanned into media.    Image: MRI Brain  07/10/2017   Stable minor subcortical & periventricular white matter changes; MS   LASER ABLATION Left 2014   left greater saphenous vein, then endoevous chemical ablation and sclerotherpay.    procedure: Excel V laser  12/07/2016   Ak tip of nose shaved-deep  margins-pt declined deeper bx, aldara cream and laser completed by derm.    Family History  Problem Relation Age of Onset   Breast cancer Mother    Asthma Brother    Depression Brother    Drug abuse Brother    Learning disabilities Brother    Stroke Maternal Grandmother    Allergies as of 08/21/2020   No Known Allergies      Medication List        Accurate as of August 21, 2020  2:56 PM. If you have any questions, ask your nurse or doctor.          Calcium 500-125 MG-UNIT Tabs Take 2 tablets by mouth daily.   DECONGESTANT PO Take by mouth.   Fish Oil 1200 MG Caps Take 1 capsule by mouth 2 (two) times daily.   hyoscyamine 0.125 MG SL tablet Commonly known as: LEVSIN SL Place 1 tablet (0.125 mg total) under the tongue every 4 (four) hours as needed.   loratadine 10 MG tablet Commonly known as:  CLARITIN Take 10 mg by mouth daily.   LORazepam 1 MG tablet Commonly known as: ATIVAN Take 1 tablet (1 mg total) by mouth every 12 (twelve) hours as needed for anxiety.   Mavenclad (7 Tabs) 10 MG Tbpk Generic drug: Cladribine (7 Tabs) Take by mouth. Year 1 Month 1 and 2: Days 1-2: 2 tabs per day and Days 3-5: 1 tab per day to equal 7 tabs total   metroNIDAZOLE 0.75 % cream Commonly known as: METROCREAM Apply 1 application topically 2 (two) times daily.   multivitamin capsule Take 1 capsule by mouth daily.   Olopatadine HCl 0.2 % Soln Apply 1 drop to eye daily.   Polysaccharide-Iron Complex 150 MG Caps 1 tab p.o. Monday, Wednesday and Friday.        All past medical history, surgical history, allergies, family history, immunizations andmedications were updated in the EMR today and reviewed under the history and medication portions of their EMR.     ROS: Negative, with the exception of above mentioned in HPI   Objective:  BP 97/64   Pulse 89   Temp 98.4 F (36.9 C) (Oral)   Ht 5\' 4"  (1.626 m)   Wt 154 lb (69.9 kg)   SpO2 100%   BMI 26.43 kg/m  Body mass index is 26.43 kg/m. Gen: Afebrile. No acute distress. Nontoxic in appearance, well developed, well nourished.  HENT: AT. [place].  Eyes:Pupils Equal Round Reactive to light, Extraocular movements intact,  Conjunctiva without redness, discharge or icterus. CV: RRR  Chest: CTAB, no wheeze or crackles. Good air movement, normal resp effort.  Skin: no rashes, purpura or petechiae.  Neuro:  Normal gait. PERLA. EOMi. Alert. Oriented x3  Psych: Normal affect, dress and demeanor. Normal speech. Normal thought content and judgment.  No results found. No results found. No results found for this or any previous visit (from the past 24 hour(s)).  Assessment/Plan: Taylor Clements is a 50 y.o. female present for OV for  Iron deficiency anemia, unspecified iron deficiency anemia type Rpt labs today. Continue iron, dose  will be adjusted after results if needed.  Will place referral for GI. Will wait for the results to labs to place priority if anemia is worsening or FOBT cards are positive.  - CBC - Iron, TIBC and Ferritin Panel - POC Hemoccult Bld/Stl (3-Cd Home Screen); Future   Reviewed expectations re: course of current medical issues. Discussed self-management of symptoms. Outlined signs and  symptoms indicating need for more acute intervention. Patient verbalized understanding and all questions were answered. Patient received an After-Visit Summary.    Orders Placed This Encounter  Procedures   CBC   Iron, TIBC and Ferritin Panel   POC Hemoccult Bld/Stl (3-Cd Home Screen)   No orders of the defined types were placed in this encounter.  Referral Orders  No referral(s) requested today     Note is dictated utilizing voice recognition software. Although note has been proof read prior to signing, occasional typographical errors still can be missed. If any questions arise, please do not hesitate to call for verification.   electronically signed by:  Felix Pacini, DO  [facility] [place]

## 2020-08-22 LAB — CBC
HCT: 33.4 % — ABNORMAL LOW (ref 35.0–45.0)
Hemoglobin: 10.6 g/dL — ABNORMAL LOW (ref 11.7–15.5)
MCH: 26.5 pg — ABNORMAL LOW (ref 27.0–33.0)
MCHC: 31.7 g/dL — ABNORMAL LOW (ref 32.0–36.0)
MCV: 83.5 fL (ref 80.0–100.0)
MPV: 8.8 fL (ref 7.5–12.5)
Platelets: 264 10*3/uL (ref 140–400)
RBC: 4 10*6/uL (ref 3.80–5.10)
RDW: 14.4 % (ref 11.0–15.0)
WBC: 3.6 10*3/uL — ABNORMAL LOW (ref 3.8–10.8)

## 2020-08-22 LAB — IRON,TIBC AND FERRITIN PANEL
%SAT: 8 % (calc) — ABNORMAL LOW (ref 16–45)
Ferritin: 5 ng/mL — ABNORMAL LOW (ref 16–232)
Iron: 36 ug/dL — ABNORMAL LOW (ref 45–160)
TIBC: 441 mcg/dL (calc) (ref 250–450)

## 2020-08-24 ENCOUNTER — Telehealth: Payer: Self-pay | Admitting: Family Medicine

## 2020-08-24 DIAGNOSIS — D509 Iron deficiency anemia, unspecified: Secondary | ICD-10-CM

## 2020-08-24 DIAGNOSIS — Z1211 Encounter for screening for malignant neoplasm of colon: Secondary | ICD-10-CM

## 2020-08-24 LAB — POC HEMOCCULT BLD/STL (HOME/3-CARD/SCREEN)
Card #2 Fecal Occult Blod, POC: NEGATIVE
Card #3 Fecal Occult Blood, POC: NEGATIVE
Fecal Occult Blood, POC: NEGATIVE

## 2020-08-24 MED ORDER — POLYSACCHARIDE-IRON COMPLEX 150 MG PO CAPS: 1.0000 | ORAL_CAPSULE | Freq: Every day | ORAL | 3 refills | Status: DC

## 2020-08-24 NOTE — Addendum Note (Signed)
Addended by: Paschal Dopp on: 08/24/2020 11:54 AM   Modules accepted: Orders

## 2020-08-24 NOTE — Telephone Encounter (Signed)
Spoke with pt regarding labs and instructions.   

## 2020-08-24 NOTE — Telephone Encounter (Signed)
Please inform patient the following information: Her hemoglobin and hematocrit are about the same, very mild improvement, as prior collection at the beginning of the month.  Her iron levels and her blood are still low as well as her percent saturation and iron stores.  I have refilled her iron supplement, would like her to increase to daily use. I have also referred her to gastroenterology more urgently. Please encourage her to continue the FOBT cards and return them as soon as possible.

## 2020-08-27 ENCOUNTER — Encounter: Payer: Self-pay | Admitting: Nurse Practitioner

## 2020-09-24 ENCOUNTER — Ambulatory Visit: Payer: Managed Care, Other (non HMO) | Admitting: Nurse Practitioner

## 2020-10-06 NOTE — Progress Notes (Signed)
CHIEF COMPLAINT: Iron deficiency anemia, schedule a colonoscopy   HISTORY OF PRESENT ILLNESS:  Taylor Clements is a 50 year old female with a past medical history of multiple sclerosis initially diagnosed in 2005 previously treated with Interferon from 2005 - 2021 then started on oral Cladribine.  She presents to our office today as referred by Dr. Howard Pouch for further evaluation regarding iron deficiency anemia and to schedule screening colonoscopy.  She developed alopecia and restless legs and laboratory studies were done 04/28/2020 as ordered by Dr. Raoul Pitch which showed iron deficiency anemia with a hemoglobin level 11.8 (baseline Hg 12 - 13).  Iron 41.  Iron saturation 9%.  Ferritin 3.  TIBC 450.  She was placed on oral iron 3 days weekly and repeat laboratory studies 07/28/2020 done by her neurologist showed her hemoglobin level dropped to 10.3 and she started taking oral iron daily.  She denies having any dysphagia or heartburn.  No upper abdominal pain.  No recent lower abdominal pain.  She previously had abdominal cramping with intermittent diarrhea when she was on interferon but the symptoms abated when she stopped taking interferon 1 year ago.  She typically passes a normal formed brown bowel movement every 2 to 2-1/2 days.  Her stool is darker in color since starting oral iron.  No rectal bleeding.  Stool heme cards x 26 Aug 2020 were negative. She has significant abdominal cramping if she takes more than one half capful of MiraLAX.  She has a history of menorrhagia which was previously well controlled when she took oral birth control pills.  She stopped taking BCPS about 3 years ago which resulted in intermittent menorrhagia.  She is currently perimenopausal and sometimes skips 1 month without having menstrual cycle.  Her LMP was 09/23/2020 and she reported having heavy menstrual flow for about 3 days in the next 2 to 4 days were lighter in flow.  She takes Ibuprofen 400 mg once or twice monthly  for headaches.  She denies ever having an EGD or screening colonoscopy.  No known family history of esophageal, gastric or colon cancer.    CBC Latest Ref Rng & Units 08/21/2020 07/28/2020 04/28/2020  WBC 3.8 - 10.8 Thousand/uL 3.6(L) 3.8 4.0  Hemoglobin 11.7 - 15.5 g/dL 10.6(L) 10.3(L) 11.8(L)  Hematocrit 35.0 - 45.0 % 33.4(L) 32.0(L) 35.8(L)  Platelets 140 - 400 Thousand/uL 264 260 279.0    CMP Latest Ref Rng & Units 07/28/2020 04/28/2020 12/11/2019  Glucose 70 - 99 mg/dL - 86 -  BUN 6 - 23 mg/dL - 11 -  Creatinine 0.40 - 1.20 mg/dL - 0.68 -  Sodium 135 - 145 mEq/L - 138 -  Potassium 3.5 - 5.1 mEq/L - 4.0 -  Chloride 96 - 112 mEq/L - 103 -  CO2 19 - 32 mEq/L - 27 -  Calcium 8.4 - 10.5 mg/dL - 9.9 -  Total Protein 6.0 - 8.5 g/dL 7.2 7.6 7.4  Total Bilirubin 0.0 - 1.2 mg/dL <0.2 0.4 0.2  Alkaline Phos 44 - 121 IU/L 73 64 75  AST 0 - 40 IU/L '17 16 18  ' ALT 0 - 32 IU/L '12 12 12     ' Past Medical History:  Diagnosis Date   Actinic keratitis 12/07/2016   shaved, deep margins- pt declined further biospy, aldara and  Excel V laser completed by derm   Allergy    Chicken pox    Idiopathic chronic venous hypertension of both legs with inflammation    Menorrhagia  control on BCP   Multiple sclerosis ([place]) 2005   diagnosed 2005; after optic Neuritis. Has IBS, fatiue and right arm paresthesia w/ flares   Optic neuritis due to multiple sclerosis ([place]) 2007   loss vision; IV steroids --> vision returned.    Phobia, flying    uses ativan with flights   Post partum depression    Venous insufficiency (chronic) (peripheral)    Past Surgical History:  Procedure Laterality Date   DILATION AND EVACUATION  2003   nonviable pregnancy   image: duplex LLE Left 05/16/2016   scanned into media.    Image: MRI Brain  07/10/2017   Stable minor subcortical & periventricular white matter changes; MS   LASER ABLATION Left 2014   left greater saphenous vein, then endoevous chemical ablation and  sclerotherpay.    procedure: Excel V laser  12/07/2016   Ak tip of nose shaved-deep margins-pt declined deeper bx, aldara cream and laser completed by derm.    Social History: She is married. She has one daughter. She works in [employer] information [employer]. She smoked cigarettes less than 1 year during college.No alcohol. No drug use.   Family History: Father's history unclear. Mother with history of breast cancer. Maternal grandmother had heart issues. Brother with asthma, learning disability, drug abuse and depression.   No Known Allergies    Outpatient Encounter Medications as of 10/07/2020  Medication Sig   Calcium 500-125 MG-UNIT TABS Take 2 tablets by mouth daily.   Cladribine, 7 Tabs, (MAVENCLAD, 7 TABS,) 10 MG TBPK Take by mouth. Year 1 Month 1 and 2: Days 1-2: 2 tabs per day and Days 3-5: 1 tab per day to equal 7 tabs total   hyoscyamine (LEVSIN SL) 0.125 MG SL tablet Place 1 tablet (0.125 mg total) under the tongue every 4 (four) hours as needed.   loratadine (CLARITIN) 10 MG tablet Take 10 mg by mouth daily.   LORazepam (ATIVAN) 1 MG tablet Take 1 tablet (1 mg total) by mouth every 12 (twelve) hours as needed for anxiety.   metroNIDAZOLE (METROCREAM) 0.75 % cream Apply 1 application topically 2 (two) times daily.   Multiple Vitamin (MULTIVITAMIN) capsule Take 1 capsule by mouth daily.   Olopatadine HCl 0.2 % SOLN Apply 1 drop to eye daily.   Omega-3 Fatty Acids (FISH OIL) 1200 MG CAPS Take 1 capsule by mouth 2 (two) times daily.   Polysaccharide-Iron Complex 150 MG CAPS Take 1 capsule by mouth daily.   Pseudoephedrine HCl (DECONGESTANT PO) Take by mouth.   No facility-administered encounter medications on file as of 10/07/2020.    REVIEW OF SYSTEMS:  Gen: + Fatigue. Denies fever, sweats or chills. No weight loss.  CV: Denies chest pain, palpitations or edema. Resp: Denies cough, shortness of breath of hemoptysis.  GI: See HPI.  GU : Denies urinary burning, blood in urine,  increased urinary frequency or incontinence. MS: + Restless legs. Denies joint pain.  Derm: Denies rash, itchiness, skin lesions or unhealing ulcers. Psych: Denies depression, anxiety or memory loss. Heme: Denies bruising, bleeding. Neuro:  Denies headaches, dizziness or paresthesias. Endo:  Denies any problems with DM, thyroid or adrenal function.  PHYSICAL EXAM: BP 102/62 (BP Location: Right Arm, Patient Position: Sitting, Cuff Size: Normal)   Pulse 93   Ht '5\' 4"'  (1.626 m)   Wt 155 lb 8 oz (70.5 kg)   SpO2 100%   BMI 26.69 kg/m   General: Well developed 50 year old female no acute distress. Head:  Normocephalic and atraumatic. Eyes:  Sclerae non-icteric, conjunctive pink. Ears: Normal auditory acuity. Mouth: Dentition intact. No ulcers or lesions.  Neck: Supple, no lymphadenopathy or thyromegaly.  Lungs: Clear bilaterally to auscultation without wheezes, crackles or rhonchi. Heart: Regular rate and rhythm. No murmur, rub or gallop appreciated.  Abdomen: Soft, nontender, non distended. No masses. No hepatosplenomegaly. Normoactive bowel sounds x 4 quadrants.  Rectal:  Musculoskeletal: Symmetrical with no gross deformities. Skin: Warm and dry. No rash or lesions on visible extremities. Extremities: No edema. Neurological: Alert oriented x 4, no focal deficits.  Psychological:  Alert and cooperative. Normal mood and affect.  ASSESSMENT AND PLAN:  86) 50 year old female with IDA. No overt GI bleeding. Negative heme cards x 3. -Colonoscopy to rule out colorectal cancer benefits and risks discussed including risk with sedation, risk of bleeding, perforation and infection  -Patient was advised to eat lightly and to hydrate well several days before colonoscopy prep day, she reported having significant abdominal cramping when using 1 capful of MiraLAX therefore MiraLAX bowel prep was not prescribed. Plenvu bowel prep kit given to patient.  -EGD to rule out celiac disease, UGI malignancy.  Patient declined EGD at this time.  -CBC, iron, iron saturation, TIBC, ferritin, TTG and IGA -Further recommendations to be determined after the above lab results and colonoscopy completed  -Patient instructed to hold oral iron for 1 week prior to her colonoscopy, to restart after procedure completed  2) History of Multiple Sclerosis   3) History of menorrhagia, perimenopausal -Continue follow-up with PCP/gynecologist    CC:  Kuneff, Renee A, DO

## 2020-10-07 ENCOUNTER — Ambulatory Visit: Payer: Managed Care, Other (non HMO) | Admitting: Nurse Practitioner

## 2020-10-07 ENCOUNTER — Other Ambulatory Visit: Payer: Managed Care, Other (non HMO)

## 2020-10-07 ENCOUNTER — Other Ambulatory Visit (INDEPENDENT_AMBULATORY_CARE_PROVIDER_SITE_OTHER): Payer: Managed Care, Other (non HMO)

## 2020-10-07 ENCOUNTER — Encounter: Payer: Self-pay | Admitting: Nurse Practitioner

## 2020-10-07 VITALS — BP 102/62 | HR 93 | Ht 64.0 in | Wt 155.5 lb

## 2020-10-07 DIAGNOSIS — D509 Iron deficiency anemia, unspecified: Secondary | ICD-10-CM

## 2020-10-07 DIAGNOSIS — Z1211 Encounter for screening for malignant neoplasm of colon: Secondary | ICD-10-CM | POA: Diagnosis not present

## 2020-10-07 LAB — CBC WITH DIFFERENTIAL/PLATELET
Basophils Absolute: 0 10*3/uL (ref 0.0–0.1)
Basophils Relative: 0.5 % (ref 0.0–3.0)
Eosinophils Absolute: 0.1 10*3/uL (ref 0.0–0.7)
Eosinophils Relative: 1.6 % (ref 0.0–5.0)
HCT: 34.8 % — ABNORMAL LOW (ref 36.0–46.0)
Hemoglobin: 11.5 g/dL — ABNORMAL LOW (ref 12.0–15.0)
Lymphocytes Relative: 21.2 % (ref 12.0–46.0)
Lymphs Abs: 0.8 10*3/uL (ref 0.7–4.0)
MCHC: 32.9 g/dL (ref 30.0–36.0)
MCV: 82.5 fl (ref 78.0–100.0)
Monocytes Absolute: 0.3 10*3/uL (ref 0.1–1.0)
Monocytes Relative: 7.5 % (ref 3.0–12.0)
Neutro Abs: 2.8 10*3/uL (ref 1.4–7.7)
Neutrophils Relative %: 69.2 % (ref 43.0–77.0)
Platelets: 277 10*3/uL (ref 150.0–400.0)
RBC: 4.22 Mil/uL (ref 3.87–5.11)
RDW: 15.6 % — ABNORMAL HIGH (ref 11.5–15.5)
WBC: 4 10*3/uL (ref 4.0–10.5)

## 2020-10-07 LAB — IBC + FERRITIN
Ferritin: 5.7 ng/mL — ABNORMAL LOW (ref 10.0–291.0)
Iron: 79 ug/dL (ref 42–145)
Saturation Ratios: 15.9 % — ABNORMAL LOW (ref 20.0–50.0)
TIBC: 497 ug/dL — ABNORMAL HIGH (ref 250.0–450.0)
Transferrin: 355 mg/dL (ref 212.0–360.0)

## 2020-10-07 NOTE — Addendum Note (Signed)
Addended by: Elita Boone E on: 10/07/2020 09:28 AM   Modules accepted: Orders

## 2020-10-07 NOTE — Patient Instructions (Addendum)
LABS:  Lab work has been ordered for you today. Our lab is located in the basement. Press "B" on the elevator. The lab is located at the first door on the left as you exit the elevator.  HEALTHCARE LAWS AND MY CHART RESULTS: Due to recent changes in healthcare laws, you may see the results of your imaging and laboratory studies on MyChart before your provider has had a chance to review them.   We understand that in some cases there may be results that are confusing or concerning to you. Not all laboratory results come back in the same time frame and the provider may be waiting for multiple results in order to interpret others.  Please give [place] 48 hours in order for your provider to thoroughly review all the results before contacting the office for clarification of your results.   PROCEDURES: You have been scheduled for an Colonoscopy. We have given you a sample of Plenvu.Please follow the written instructions given to you at your visit today. If you use inhalers (even only as needed), please bring them with you on the day of your procedure.  Further recommendations to be determined after colonoscopy.  Do not take Iron supplement 1 week prior to colonoscopy, restart after the procedure.  It was great seeing you today! Thank you for entrusting me with your care and choosing [facility].  Arnaldo Natal, CRNP  The [facility] providers would like to encourage you to use [facility] to communicate with providers for non-urgent requests or questions.  Due to long hold times on the telephone, sending your provider a message by [facility] may be faster and more efficient way to get a response. Please allow 48 business hours for a response.  Please remember that this is for non-urgent requests/questions.  If you are age 57 or older, your body mass index should be between 23-30. Your Body mass index is 26.69 kg/m. If this is out of the aforementioned range listed, please consider follow up  with your Primary Care Provider.  If you are age 25 or younger, your body mass index should be between 19-25. Your Body mass index is 26.69 kg/m. If this is out of the aformentioned range listed, please consider follow up with your Primary Care Provider.

## 2020-10-08 LAB — TISSUE TRANSGLUTAMINASE ABS,IGG,IGA
(tTG) Ab, IgA: <1 U/mL
(tTG) Ab, IgG: <1 U/mL

## 2020-10-08 LAB — IGA: Immunoglobulin A: 259 mg/dL (ref 47–310)

## 2020-10-08 NOTE — Progress Notes (Signed)
Agree with the assessment and plan as outlined by Alcide Evener, NP.  Would also check for H. Pylori as this has been associated with iron deficiency anemia.   Lauren Modisette E. Tomasa Rand, MD [facility]

## 2020-10-08 NOTE — Addendum Note (Signed)
Addended by: Annett Fabian on: 10/08/2020 05:11 PM   Modules accepted: Orders

## 2020-10-08 NOTE — Progress Notes (Signed)
Patient has been notified  She will come for stool test at her convenience

## 2020-10-08 NOTE — Progress Notes (Signed)
Lauren, pls contact the patient and let her know Dr. Tomasa Rand would like her to complete a stool test to check for H. Pylori which can also cause iron deficiency anemia. Pls enter order for H. Pylori stool ag. THX

## 2020-10-15 ENCOUNTER — Other Ambulatory Visit: Payer: Self-pay

## 2020-10-15 ENCOUNTER — Ambulatory Visit (AMBULATORY_SURGERY_CENTER): Payer: Managed Care, Other (non HMO) | Admitting: Gastroenterology

## 2020-10-15 ENCOUNTER — Encounter: Payer: Self-pay | Admitting: Gastroenterology

## 2020-10-15 VITALS — BP 94/68 | HR 72 | Temp 98.6°F | Resp 10 | Ht 64.0 in | Wt 155.0 lb

## 2020-10-15 DIAGNOSIS — D509 Iron deficiency anemia, unspecified: Secondary | ICD-10-CM

## 2020-10-15 DIAGNOSIS — Z1211 Encounter for screening for malignant neoplasm of colon: Secondary | ICD-10-CM

## 2020-10-15 MED ORDER — SODIUM CHLORIDE 0.9 % IV SOLN: 500.0000 mL | Freq: Once | INTRAVENOUS | Status: DC

## 2020-10-15 NOTE — Op Note (Signed)
[facility] Patient Name: Taylor Clements Procedure Date: 10/15/2020 3:47 PM MRN: [id number] Endoscopist: Lorin Picket E. Tomasa Rand , MD Age: 50 Referring MD:  Date of Birth: 12/26/1970 Gender: Female Account #: [id number] Procedure:                Colonoscopy Indications:              Iron deficiency anemia Medicines:                Monitored Anesthesia Care Procedure:                Pre-Anesthesia Assessment:                           - Prior to the procedure, a History and Physical                            was performed, and patient medications and                            allergies were reviewed. The patient's tolerance of                            previous anesthesia was also reviewed. The risks                            and benefits of the procedure and the sedation                            options and risks were discussed with the patient.                            All questions were answered, and informed consent                            was obtained. Prior Anticoagulants: The patient has                            taken no previous anticoagulant or antiplatelet                            agents. ASA Grade Assessment: II - A patient with                            mild systemic disease. After reviewing the risks                            and benefits, the patient was deemed in                            satisfactory condition to undergo the procedure.                           After obtaining informed consent, the colonoscope  was passed under direct vision. Throughout the                            procedure, the patient's blood pressure, pulse, and                            oxygen saturations were monitored continuously. The                            CF [id number] #[id number] was introduced through the anus                            and advanced to the the terminal ileum, with                            identification of the appendiceal  orifice and IC                            valve. The colonoscopy was somewhat difficult due                            to a tortuous colon. The patient tolerated the                            procedure well. The quality of the bowel                            preparation was excellent. The terminal ileum,                            ileocecal valve, appendiceal orifice, and rectum                            were photographed. Scope In: 4:06:30 PM Scope Out: 4:22:23 PM Scope Withdrawal Time: 0 hours 8 minutes 27 seconds  Total Procedure Duration: 0 hours 15 minutes 53 seconds  Findings:                 The perianal and digital rectal examinations were                            normal. Pertinent negatives include normal                            sphincter tone and no palpable rectal lesions.                           The colon (entire examined portion) appeared normal.                           The terminal ileum appeared normal.                           The retroflexed view of the distal rectum and anal  verge was normal and showed no anal or rectal                            abnormalities. Complications:            No immediate complications. Estimated Blood Loss:     Estimated blood loss: none. Impression:               - The entire examined colon is normal.                           - The examined portion of the ileum was normal.                           - The distal rectum and anal verge are normal on                            retroflexion view.                           - No specimens collected.                           - No abnormalities to explain iron deficiency                            anemia. Recommendation:           - Patient has a contact number available for                            emergencies. The signs and symptoms of potential                            delayed complications were discussed with the                            patient.  Return to normal activities tomorrow.                            Written discharge instructions were provided to the                            patient.                           - Resume previous diet.                           - Continue present medications.                           - Repeat colonoscopy in 10 years for screening                            purposes.                           -  Recommend H. pylori stool antigen to further                            evaluate iron deficiency anemia. If negative,                            consider upper endoscopy/VCE. Brevon Dewald E. Tomasa Rand, MD 10/15/2020 4:29:16 PM This report has been signed electronically.

## 2020-10-15 NOTE — Patient Instructions (Signed)
Resume previous diet and medications. ° °Repeat colonoscopy in 10 years for screening purposes. ° ° ° °YOU HAD AN ENDOSCOPIC PROCEDURE TODAY AT THE [facility]:   Refer to the procedure report that was given to you for any specific questions about what was found during the examination.  If the procedure report does not answer your questions, please call your gastroenterologist to clarify.  If you requested that your care partner not be given the details of your procedure findings, then the procedure report has been included in a sealed envelope for you to review at your convenience later. ° °YOU SHOULD EXPECT: Some feelings of bloating in the abdomen. Passage of more gas than usual.  Walking can help get rid of the air that was put into your GI tract during the procedure and reduce the bloating. If you had a lower endoscopy (such as a colonoscopy or flexible sigmoidoscopy) you may notice spotting of blood in your stool or on the toilet paper. If you underwent a bowel prep for your procedure, you may not have a normal bowel movement for a few days. ° °Please Note:  You might notice some irritation and congestion in your nose or some drainage.  This is from the oxygen used during your procedure.  There is no need for concern and it should clear up in a day or so. ° °SYMPTOMS TO REPORT IMMEDIATELY: ° °Following lower endoscopy (colonoscopy or flexible sigmoidoscopy): ° Excessive amounts of blood in the stool ° Significant tenderness or worsening of abdominal pains ° Swelling of the abdomen that is new, acute ° Fever of 100°F or higher ° ° °For urgent or emergent issues, a gastroenterologist can be reached at any hour by calling [phone]. °Do not use MyChart messaging for urgent concerns.  ° ° °DIET:  We do recommend a small meal at first, but then you may proceed to your regular diet.  Drink plenty of fluids but you should avoid alcoholic beverages for 24 hours. ° °ACTIVITY:  You should plan to take  it easy for the rest of today and you should NOT DRIVE or use heavy machinery until tomorrow (because of the sedation medicines used during the test).   ° °FOLLOW UP: °Our staff will call the number listed on your records 48-72 hours following your procedure to check on you and address any questions or concerns that you may have regarding the information given to you following your procedure. If we do not reach you, we will leave a message.  We will attempt to reach you two times.  During this call, we will ask if you have developed any symptoms of COVID 19. If you develop any symptoms (ie: fever, flu-like symptoms, shortness of breath, cough etc.) before then, please call [phone].  If you test positive for Covid 19 in the 2 weeks post procedure, please call and report this information to us.   ° °If any biopsies were taken you will be contacted by phone or by letter within the next 1-3 weeks.  Please call us at [phone] if you have not heard about the biopsies in 3 weeks.  ° ° °SIGNATURES/CONFIDENTIALITY: °You and/or your care partner have signed paperwork which will be entered into your electronic medical record.  These signatures attest to the fact that that the information above on your After Visit Summary has been reviewed and is understood.  Full responsibility of the confidentiality of this discharge information lies with you and/or your care-partner.  °

## 2020-10-15 NOTE — Progress Notes (Signed)
A and O x3. Report to RN. Tolerated MAC anesthesia well. 

## 2020-10-15 NOTE — Progress Notes (Signed)
VS completed by CW. ? ?Medical history reviewed and updated. ? ?

## 2020-10-15 NOTE — Progress Notes (Signed)
History and Physical Interval Note:  No changes to patient's symptoms or medical history since her clinic visit Sept 14th.  10/15/2020 4:02 PM  Taylor Clements  has presented today for endoscopic procedure(s), with the diagnosis of  Encounter Diagnoses  Name Primary?   Iron deficiency anemia, unspecified iron deficiency anemia type Yes   Encounter for screening colonoscopy   .  The various methods of evaluation and treatment have been discussed with the patient and/or family. After consideration of risks, benefits and other options for treatment, the patient has consented to  the endoscopic procedure(s).   The patient's history has been reviewed, patient examined, no change in status, stable for endoscopic procedure(s).  I have reviewed the patient's chart and labs.  Questions were answered to the patient's satisfaction.     Tauno Falotico E. Tomasa Rand, MD [facility]

## 2020-10-19 ENCOUNTER — Telehealth: Payer: Self-pay

## 2020-10-19 NOTE — Telephone Encounter (Signed)
  Follow up Call-  Call back number 10/15/2020  Post procedure Call Back phone  # [phone]  Permission to leave phone message Yes  Some recent data might be hidden     Patient questions:  Do you have a fever, pain , or abdominal swelling? No. Pain Score  0 *  Have you tolerated food without any problems? Yes.    Have you been able to return to your normal activities? Yes.    Do you have any questions about your discharge instructions: Diet   No. Medications  No. Follow up visit  No.  Do you have questions or concerns about your Care? No.  Actions: * If pain score is 4 or above: No action needed, pain <4.

## 2020-10-22 ENCOUNTER — Other Ambulatory Visit: Payer: Self-pay

## 2020-10-22 DIAGNOSIS — D509 Iron deficiency anemia, unspecified: Secondary | ICD-10-CM

## 2020-10-23 LAB — HELICOBACTER PYLORI  SPECIAL ANTIGEN
MICRO NUMBER:: 12440231
SPECIMEN QUALITY: ADEQUATE

## 2020-12-21 ENCOUNTER — Other Ambulatory Visit: Payer: Self-pay

## 2020-12-21 ENCOUNTER — Ambulatory Visit: Payer: No Typology Code available for payment source | Admitting: Neurology

## 2020-12-21 ENCOUNTER — Encounter: Payer: Self-pay | Admitting: Neurology

## 2020-12-21 VITALS — BP 109/75 | HR 79 | Ht 64.5 in | Wt 152.0 lb

## 2020-12-21 DIAGNOSIS — G35 Multiple sclerosis: Secondary | ICD-10-CM | POA: Diagnosis not present

## 2020-12-21 DIAGNOSIS — R200 Anesthesia of skin: Secondary | ICD-10-CM | POA: Diagnosis not present

## 2020-12-21 DIAGNOSIS — Z79899 Other long term (current) drug therapy: Secondary | ICD-10-CM

## 2020-12-21 DIAGNOSIS — Z8669 Personal history of other diseases of the nervous system and sense organs: Secondary | ICD-10-CM

## 2020-12-21 NOTE — Progress Notes (Signed)
[facility]  PATIENT: Taylor Clements DOB: 1970-09-01  REFERRING DOCTOR OR PCP:  Felix Pacini   _________________________________   HISTORICAL  CHIEF COMPLAINT:  She is a 50 y.o. woman with relapsing remitting multiple sclerosis diagnosed in 2005  Update 12/21/2020: She feels her MS is stable.   She did her second year of Mavenclad soon May 15-19 and June 12-16, 2022.  (She had her first year of Mavenclad in May 18-22 and Jul 08, 2019).   She had fatigue and headaches a few days after each cycle.  She is completely at baseline now.  Before she had these doses, she had an MRI of the brain 05/23/2020.  It showed no new lesions compared to 04/17/2019.  She denies significant difficulty with gait, balance, strength and sensation.  She is able to walk > 3 miles without stopping.   She sometimes feels she needs to hold the bannister going downstairs but not upstairs.      Bladder function is fine.   Vision is fine (needs readers and gets some eye strain though).     Notes fatigue some afternoons/evenings.   She sleeps well most nights.   Mood is good.   She denies cognitive issues.  She has mild anemia and her PCP checked labs showing very low ferritin of 3 ng/mL (16-232).   Her colonoscopy w fine.         MS History:  She was diagnosed with MS in late 2005.  She first presented with right optic neuritis that year.   She was treated with several days of IV Solu-Medrol and vision improved.   An MRI was consistent with MS.   She was initially placed on Avonex late 2005 or early 2006.  Around 2014, after Plegridy was approved, she switched from Avonex to Plegridy.   She has no definite exacerbation.  Around 2013, she noted spasticity in her right arm and more fatigue.     She was placed on baclofen but only took it about a month or less.     She has not been on any treatment for her fatigue.   She had her 1st year Mavenclad May 18/Jul 08 2019.  She had the second year  Mavenclad May/June 2022.    Imaging studies: MRI of the brain performed in 2018.  It shows 6 or 7 small T2/flair hyperintense foci, 2 in the periventricular white matter and the rest in the subcortical and deep white matter.  None of the foci appear to be acute.  There was no atrophy.  There are no lesions in the infratentorial white matter.  None of the foci enhance.  MRI 04/17/19 showed scattered T2/FLAIR hyperintense foci in the periventricular, juxtacortical and deep white matter of the hemispheres in a pattern and configuration compatible with chronic demyelinating plaque associated with multiple sclerosis.  None the foci appear to be acute.  They do not enhance.  Compared to the MRI dated 07/10/2017, there are no new lesions.  MRI 05/23/2020 shwoed T2/flair hyperintense foci in the hemispheres.  None of the foci enhance or appear to be acute.  They are consistent with chronic demyelinating plaque associated with multiple sclerosis.  Compared to the MRI from 04/17/2019, there are no new lesions.  She has no FH of MS or other autoimmune disorder.        REVIEW OF SYSTEMS: Constitutional: No fevers, chills, sweats, or change in appetite.  She has fatigue.    Sleeps well Eyes: No visual  changes, double vision, eye pain Ear, nose and throat: No hearing loss, ear pain, nasal congestion, sore throat Cardiovascular: No chest pain, palpitations Respiratory:  No shortness of breath at rest or with exertion.   No wheezes GastrointestinaI: No nausea, vomiting, abdominal pain, fecal incontinence.  Occ diarrhea, maybe IBS.  Genitourinary:  No dysuria,.  No nocturia.  Some urgency Musculoskeletal:  No neck pain, back pain Integumentary: No rash, pruritus, skin lesions Neurological: as above Psychiatric: No depression at this time.  No anxiety Endocrine: No palpitations, diaphoresis, change in appetite, change in weigh or increased thirst Hematologic/Lymphatic:  No anemia, purpura,  petechiae. Allergic/Immunologic: No itchy/runny eyes, nasal congestion, recent allergic reactions, rashes  ALLERGIES: No Known Allergies  HOME MEDICATIONS:  Current Outpatient Medications:    Calcium 500-125 MG-UNIT TABS, Take 2 tablets by mouth daily., Disp: , Rfl:    Cladribine, 7 Tabs, (MAVENCLAD, 7 TABS,) 10 MG TBPK, Take by mouth. Year 1 Month 1 and 2: Days 1-2: 2 tabs per day and Days 3-5: 1 tab per day to equal 7 tabs total, Disp: , Rfl:    hyoscyamine (LEVSIN SL) 0.125 MG SL tablet, Place 1 tablet (0.125 mg total) under the tongue every 4 (four) hours as needed., Disp: 90 tablet, Rfl: 1   loratadine (CLARITIN) 10 MG tablet, Take 10 mg by mouth daily., Disp: , Rfl:    LORazepam (ATIVAN) 1 MG tablet, Take 1 tablet (1 mg total) by mouth every 12 (twelve) hours as needed for anxiety., Disp: 10 tablet, Rfl: 0   metroNIDAZOLE (METROCREAM) 0.75 % cream, Apply 1 application topically 2 (two) times daily., Disp: , Rfl:    Multiple Vitamin (MULTIVITAMIN) capsule, Take 1 capsule by mouth daily., Disp: , Rfl:    Olopatadine HCl 0.2 % SOLN, Apply 1 drop to eye daily., Disp: , Rfl:    Omega-3 Fatty Acids (FISH OIL) 1200 MG CAPS, Take 1 capsule by mouth 2 (two) times daily., Disp: , Rfl:    Polysaccharide-Iron Complex 150 MG CAPS, Take 1 capsule by mouth daily., Disp: 90 capsule, Rfl: 3  Current Facility-Administered Medications:    0.9 %  sodium chloride infusion, 500 mL, Intravenous, Once, Taylor Lucks, MD  PAST MEDICAL HISTORY: Past Medical History:  Diagnosis Date   Actinic keratitis 12/07/2016   shaved, deep margins- pt declined further biospy, aldara and  Excel V laser completed by derm   Allergy    Anemia    Chicken pox    Idiopathic chronic venous hypertension of both legs with inflammation    Menorrhagia    control on BCP   Multiple sclerosis (HCC) 2005   diagnosed 2005; after optic Neuritis. Has IBS, fatiue and right arm paresthesia w/ flares   Optic neuritis due to  multiple sclerosis (HCC) 2007   loss vision; IV steroids --> vision returned.    Phobia, flying    uses ativan with flights   Post partum depression    Venous insufficiency (chronic) (peripheral)     PAST SURGICAL HISTORY: Past Surgical History:  Procedure Laterality Date   DILATION AND EVACUATION  2003   nonviable pregnancy   image: duplex LLE Left 05/16/2016   scanned into media.    Image: MRI Brain  07/10/2017   Stable minor subcortical & periventricular white matter changes; MS   LASER ABLATION Left 2014   left greater saphenous vein, then endoevous chemical ablation and sclerotherpay.    procedure: Excel V laser  12/07/2016   Ak tip of nose shaved-deep margins-pt  declined deeper bx, aldara cream and laser completed by derm.     FAMILY HISTORY: Family History  Problem Relation Age of Onset   Breast cancer Mother    Asthma Brother    Depression Brother    Drug abuse Brother    Learning disabilities Brother    Stroke Maternal Grandmother    Colon cancer Neg Hx    Esophageal cancer Neg Hx    Pancreatic cancer Neg Hx    Stomach cancer Neg Hx    Rectal cancer Neg Hx     SOCIAL HISTORY:  Social History   Socioeconomic History   Marital status: Married    Spouse name: Taylor Clements   Number of children: 1   Years of education: BA   Highest education level: Not on file  Occupational History   Occupation: n/a  Tobacco Use   Smoking status: Never   Smokeless tobacco: Never  Vaping Use   Vaping Use: Never used  Substance and Sexual Activity   Alcohol use: Not Currently    Comment: rare   Drug use: Never   Sexual activity: Yes    Partners: Male    Birth control/protection: Condom  Other Topics Concern   Not on file  Social History Narrative   Marital status/children/pets: married, 1 child   Safety:      -smoke alarm in the home:Yes     - wears seatbelt: Yes     - Feels safe in their relationships: Yes      Right handed    Caffeine use: tea daily   Social  Determinants of Health   Financial Resource Strain: Not on file  Food Insecurity: Not on file  Transportation Needs: Not on file  Physical Activity: Not on file  Stress: Not on file  Social Connections: Not on file  Intimate Partner Violence: Not on file     PHYSICAL EXAM (from 03/27/2018 initial consult)  Vitals:   12/21/20 1257  BP: 109/75  Pulse: 79  Weight: 152 lb (68.9 kg)  Height: 5' 4.5" (1.638 m)    Body mass index is 25.69 kg/m.  VA:   OD 20/25    OS 20/20  General: The patient is well-developed and well-nourished and in no acute distress  Skin: Extremities are without significant edema.  Neurologic Exam  Mental status: The patient is alert and oriented x 3 at the time of the examination. The patient has apparent normal recent and remote memory, with an apparently normal attention span and concentration ability.   Speech is normal.  Cranial nerves: Extraocular movements are full.. There is good facial sensation to soft touch bilaterally.Facial strength is normal.  Trapezius and sternocleidomastoid strength is normal. No dysarthria is noted. No obvious hearing deficits are noted.  Motor:  Muscle bulk is normal.   Tone is normal. Strength is  5 / 5 in all 4 extremities.  Can rise up from a chair without using her arms.  Sensory: Sensory testing is intact to pinprick, soft touch and vibration sensation in all 4 extremities.  Coordination: Cerebellar testing reveals good finger-nose-finger and heel-to-shin bilaterally.  Gait and station: Station is normal.    The gait is normal.  Tandem gait is mildly wide... Romberg is negative.   Reflexes: Deep tendon reflexes are symmetric and normal bilaterally.      Multiple sclerosis (HCC) - Plan: CBC with Differential/Platelet, Hepatic function panel  High risk medication use - Plan: CBC with Differential/Platelet, Hepatic function panel  Numbness  History of  optic neuritis   1.    Her MS appears to be stable.  She  has had no exacerbations since starting Mavenclad.  She did the second year in May and June 2022.  We will check some blood work today.   2.   Stay active and exercise as tolerated. 3.   She will return to see me in 6 months, depending on which treatment she begins.  Research data: Today, she scored EDSS = 1.0 due to 1 on visual subscales all others are 0.  Amb Index = 0   Barabara Motz A. Epimenio Foot, MD, [facility] 12/21/2020, 5:14 PM Certified in Neurology, Clinical Neurophysiology, Sleep Medicine, Pain Medicine and Neuroimaging  [facility] [address] [place], [place] [address] [phone]

## 2020-12-22 LAB — CBC WITH DIFFERENTIAL/PLATELET
Basophils Absolute: 0 10*3/uL (ref 0.0–0.2)
Basos: 0 %
EOS (ABSOLUTE): 0.1 10*3/uL (ref 0.0–0.4)
Eos: 2 %
Hematocrit: 32.7 % — ABNORMAL LOW (ref 34.0–46.6)
Hemoglobin: 10.4 g/dL — ABNORMAL LOW (ref 11.1–15.9)
Immature Grans (Abs): 0 10*3/uL (ref 0.0–0.1)
Immature Granulocytes: 0 %
Lymphocytes Absolute: 1.3 10*3/uL (ref 0.7–3.1)
Lymphs: 34 %
MCH: 27.1 pg (ref 26.6–33.0)
MCHC: 31.8 g/dL (ref 31.5–35.7)
MCV: 85 fL (ref 79–97)
Monocytes Absolute: 0.3 10*3/uL (ref 0.1–0.9)
Monocytes: 8 %
Neutrophils Absolute: 2 10*3/uL (ref 1.4–7.0)
Neutrophils: 56 %
Platelets: 273 10*3/uL (ref 150–450)
RBC: 3.84 x10E6/uL (ref 3.77–5.28)
RDW: 14.1 % (ref 11.7–15.4)
WBC: 3.7 10*3/uL (ref 3.4–10.8)

## 2020-12-22 LAB — HEPATIC FUNCTION PANEL
ALT: 11 IU/L (ref 0–32)
AST: 15 IU/L (ref 0–40)
Albumin: 4 g/dL (ref 3.8–4.8)
Alkaline Phosphatase: 71 IU/L (ref 44–121)
Bilirubin Total: <0.2 mg/dL (ref 0.0–1.2)
Bilirubin, Direct: <0.1 mg/dL (ref 0.00–0.40)
Total Protein: 6.9 g/dL (ref 6.0–8.5)

## 2020-12-24 HISTORY — PX: MOHS SURGERY: SUR867

## 2021-03-23 ENCOUNTER — Other Ambulatory Visit (HOSPITAL_COMMUNITY)
Admission: RE | Admit: 2021-03-23 | Discharge: 2021-03-23 | Disposition: A | Discharge status: Home / Self Care | Payer: No Typology Code available for payment source | Source: Ambulatory Visit | Attending: Nurse Practitioner | Admitting: Nurse Practitioner

## 2021-03-23 ENCOUNTER — Other Ambulatory Visit: Payer: Self-pay | Admitting: Nurse Practitioner

## 2021-03-23 DIAGNOSIS — Z124 Encounter for screening for malignant neoplasm of cervix: Secondary | ICD-10-CM | POA: Insufficient documentation

## 2021-03-25 LAB — CYTOLOGY - PAP
Comment: NEGATIVE
Diagnosis: NEGATIVE
High risk HPV: NEGATIVE

## 2021-04-20 ENCOUNTER — Other Ambulatory Visit: Payer: Self-pay

## 2021-06-09 ENCOUNTER — Encounter: Payer: Self-pay | Admitting: Neurology

## 2021-06-09 ENCOUNTER — Ambulatory Visit (INDEPENDENT_AMBULATORY_CARE_PROVIDER_SITE_OTHER): Payer: No Typology Code available for payment source | Admitting: Neurology

## 2021-06-09 VITALS — BP 127/72 | HR 86 | Ht 64.5 in | Wt 153.0 lb

## 2021-06-09 DIAGNOSIS — G35 Multiple sclerosis: Secondary | ICD-10-CM | POA: Diagnosis not present

## 2021-06-09 DIAGNOSIS — Z8669 Personal history of other diseases of the nervous system and sense organs: Secondary | ICD-10-CM | POA: Diagnosis not present

## 2021-06-09 DIAGNOSIS — Z79899 Other long term (current) drug therapy: Secondary | ICD-10-CM | POA: Diagnosis not present

## 2021-06-09 NOTE — Progress Notes (Signed)
? ?[facility] ? ?PATIENT: Taylor Clements ?DOB: 1970/09/05 ? ?REFERRING DOCTOR OR PCP:  Howard Pouch ? ? ?_________________________________ ? ? ?HISTORICAL ? ?CHIEF COMPLAINT:  ?She is a 51 y.o. woman with relapsing remitting multiple sclerosis diagnosed in 2005 ? ?Update 06/09/2021: ?She feels her MS is stable.   She did her second year of [place] soon May 15-19 and June 12-16, 2022.  (She had her first year of [place] in May 18-22 and Jul 08, 2019).   She generally tolerated the weeks of therapy well with mild fatigue and headaches a few days after each cycle.  She is completely at baseline now.  She has noted no new symptoms ? ?Before she had the second year of therapy, she had an MRI of the brain 05/23/2020.  It showed no new lesions compared to 04/17/2019. ? ?She denies significant difficulty with gait, balance, strength and sensation.  She is able to walk > 3 miles without stopping.   She sometimes feels she needs to hold the bannister going downstairs but not upstairs.   Bladder function is fine.   Vision is fine (needs readers and gets some eye strain though).    ? ?Notes fatigue some afternoons/evenings.   She sleeps well most nights.   Mood is good.   She denies cognitive issues. ? ?She has mild anemia and her PCP checked labs showing very low ferritin of 3 ng/mL (16-232).  She had normal colonoscopy and UGD.  She also had an endometrial biopsy which was fine.   She is on Lysteda during periods to reduce flow.        ? ?She had a basal cell carcinoma in December 2022.  She had a clear Moh's margin on the second cut [facility]  Dr. Melburn Hake) ?  ? ?MS History:  ?She was diagnosed with MS in late 2005.  She first presented with right optic neuritis that year.   She was treated with several days of IV Solu-Medrol and vision improved.   An MRI was consistent with MS.   She was initially placed on Avonex late 2005 or early 2006.  Around 2014, after Plegridy was  approved, she switched from Avonex to Plegridy.   She has no definite exacerbation.  Around 2013, she noted spasticity in her right arm and more fatigue.     She was placed on baclofen but only took it about a month or less.     She has not been on any treatment for her fatigue.   She had her 1st year [place]clad May 18/Jul 08 2019.  She had the second year Mavenclad May/June 2022.   ? ?Imaging studies: ?MRI of the brain performed in 2018.  It shows 6 or 7 small T2/flair hyperintense foci, 2 in the periventricular white matter and the rest in the subcortical and deep white matter.  None of the foci appear to be acute.  There was no atrophy.  There are no lesions in the infratentorial white matter.  None of the foci enhance. ? ?MRI 04/17/19 showed scattered T2/FLAIR hyperintense foci in the periventricular, juxtacortical and deep white matter of the hemispheres in a pattern and configuration compatible with chronic demyelinating plaque associated with multiple sclerosis.  None the foci appear to be acute.  They do not enhance.  Compared to the MRI dated 07/10/2017, there are no new lesions. ? ?MRI 05/23/2020 shwoed T2/flair hyperintense foci in the hemispheres.  None of the foci enhance or appear to  be acute.  They are consistent with chronic demyelinating plaque associated with multiple sclerosis.  Compared to the MRI from 04/17/2019, there are no new lesions. ? ?She has no FH of MS or other autoimmune disorder.       ? ?REVIEW OF SYSTEMS: ?Constitutional: No fevers, chills, sweats, or change in appetite.  She has fatigue.    Sleeps well ?Eyes: No visual changes, double vision, eye pain ?Ear, nose and throat: No hearing loss, ear pain, nasal congestion, sore throat ?Cardiovascular: No chest pain, palpitations ?Respiratory:  No shortness of breath at rest or with exertion.   No wheezes ?GastrointestinaI: No nausea, vomiting, abdominal pain, fecal incontinence.  Occ diarrhea, maybe IBS.  ?Genitourinary:  No dysuria,.  No  nocturia.  Some urgency ?Musculoskeletal:  No neck pain, back pain ?Integumentary: No rash, pruritus, skin lesions ?Neurological: as above ?Psychiatric: No depression at this time.  No anxiety ?Endocrine: No palpitations, diaphoresis, change in appetite, change in weigh or increased thirst ?Hematologic/Lymphatic:  No anemia, purpura, petechiae. ?Allergic/Immunologic: No itchy/runny eyes, nasal congestion, recent allergic reactions, rashes ? ?ALLERGIES: ?No Known Allergies ? ?HOME MEDICATIONS: ? ?Current Outpatient Medications:  ?  Ascorbic Acid (VITAMIN C) 500 MG CAPS, Take 1 tablet by mouth daily., Disp: , Rfl:  ?  Calcium 500-125 MG-UNIT TABS, Take 2 tablets by mouth daily., Disp: , Rfl:  ?  Cladribine, 7 Tabs, (MAVENCLAD, 7 TABS,) 10 MG TBPK, Take by mouth. Year 1 Month 1 and 2: Days 1-2: 2 tabs per day and Days 3-5: 1 tab per day to equal 7 tabs total, Disp: , Rfl:  ?  hyoscyamine (LEVSIN SL) 0.125 MG SL tablet, Place 1 tablet (0.125 mg total) under the tongue every 4 (four) hours as needed., Disp: 90 tablet, Rfl: 1 ?  loratadine (CLARITIN) 10 MG tablet, Take 10 mg by mouth daily., Disp: , Rfl:  ?  LORazepam (ATIVAN) 1 MG tablet, Take 1 tablet (1 mg total) by mouth every 12 (twelve) hours as needed for anxiety., Disp: 10 tablet, Rfl: 0 ?  metroNIDAZOLE (METROCREAM) 0.75 % cream, Apply 1 application topically 2 (two) times daily., Disp: , Rfl:  ?  Multiple Vitamin (MULTIVITAMIN) capsule, Take 1 capsule by mouth daily., Disp: , Rfl:  ?  Olopatadine HCl 0.2 % SOLN, Apply 1 drop to eye daily., Disp: , Rfl:  ?  Omega-3 Fatty Acids (FISH OIL) 1200 MG CAPS, Take 1 capsule by mouth 2 (two) times daily., Disp: , Rfl:  ?  Polysaccharide-Iron Complex 150 MG CAPS, Take 1 capsule by mouth daily., Disp: 90 capsule, Rfl: 3 ?  triamcinolone (NASACORT ALLERGY 24HR) 55 MCG/ACT AERO nasal inhaler, Place 2 sprays into the nose daily., Disp: , Rfl:  ? ?Current Facility-Administered Medications:  ?  0.9 %  sodium chloride infusion,  500 mL, Intravenous, Once, Daryel November, MD ? ?PAST MEDICAL HISTORY: ?Past Medical History:  ?Diagnosis Date  ? Actinic keratitis 12/07/2016  ? shaved, deep margins- pt declined further biospy, aldara and  Excel V laser completed by derm  ? Allergy   ? Anemia   ? Chicken pox   ? Idiopathic chronic venous hypertension of both legs with inflammation   ? Menorrhagia   ? control on BCP  ? Multiple sclerosis ([place]) 2005  ? diagnosed 2005; after optic Neuritis. Has IBS, fatiue and right arm paresthesia w/ flares  ? Optic neuritis due to multiple sclerosis [facility]) 2007  ? loss vision; IV steroids --> vision returned.   ? Phobia, flying   ?  uses ativan with flights  ? Post partum depression   ? Venous insufficiency (chronic) (peripheral)   ? ? ?PAST SURGICAL HISTORY: ?Past Surgical History:  ?Procedure Laterality Date  ? DILATION AND EVACUATION  2003  ? nonviable pregnancy  ? image: duplex LLE Left 05/16/2016  ? scanned into media.   ? Image: MRI Brain  07/10/2017  ? Stable minor subcortical & periventricular white matter changes; MS  ? LASER ABLATION Left 2014  ? left greater saphenous vein, then endoevous chemical ablation and sclerotherpay.   ? procedure: Excel V laser  12/07/2016  ? Ak tip of nose shaved-deep margins-pt declined deeper bx, aldara cream and laser completed by derm.   ? ? ?FAMILY HISTORY: ?Family History  ?Problem Relation Age of Onset  ? Breast cancer Mother   ? Asthma Brother   ? Depression Brother   ? Drug abuse Brother   ? Learning disabilities Brother   ? Stroke Maternal Grandmother   ? Colon cancer Neg Hx   ? Esophageal cancer Neg Hx   ? Pancreatic cancer Neg Hx   ? Stomach cancer Neg Hx   ? Rectal cancer Neg Hx   ? ? ?SOCIAL HISTORY: ? ?Social History  ? ?Socioeconomic History  ? Marital status: Married  ?  Spouse name: Coralyn Mark  ? Number of children: 1  ? Years of education: BA  ? Highest education level: Not on file  ?Occupational History  ? Occupation: n/a  ?Tobacco Use  ? Smoking status: Never   ? Smokeless tobacco: Never  ?Vaping Use  ? Vaping Use: Never used  ?Substance and Sexual Activity  ? Alcohol use: Not Currently  ?  Comment: rare  ? Drug use: Never  ? Sexual activity: Yes  ?  Partners: M

## 2021-06-10 ENCOUNTER — Telehealth: Payer: Self-pay | Admitting: Neurology

## 2021-06-10 LAB — HEPATIC FUNCTION PANEL
ALT: 13 IU/L (ref 0–32)
AST: 15 IU/L (ref 0–40)
Albumin: 4.5 g/dL (ref 3.8–4.8)
Alkaline Phosphatase: 59 IU/L (ref 44–121)
Bilirubin Total: <0.2 mg/dL (ref 0.0–1.2)
Bilirubin, Direct: <0.1 mg/dL (ref 0.00–0.40)
Total Protein: 7.3 g/dL (ref 6.0–8.5)

## 2021-06-10 LAB — CBC WITH DIFFERENTIAL/PLATELET
Basophils Absolute: 0 10*3/uL (ref 0.0–0.2)
Basos: 0 %
EOS (ABSOLUTE): 0.1 10*3/uL (ref 0.0–0.4)
Eos: 2 %
Hematocrit: 36.4 % (ref 34.0–46.6)
Hemoglobin: 12 g/dL (ref 11.1–15.9)
Immature Grans (Abs): 0 10*3/uL (ref 0.0–0.1)
Immature Granulocytes: 0 %
Lymphocytes Absolute: 1.8 10*3/uL (ref 0.7–3.1)
Lymphs: 34 %
MCH: 28.2 pg (ref 26.6–33.0)
MCHC: 33 g/dL (ref 31.5–35.7)
MCV: 85 fL (ref 79–97)
Monocytes Absolute: 0.3 10*3/uL (ref 0.1–0.9)
Monocytes: 6 %
Neutrophils Absolute: 3.1 10*3/uL (ref 1.4–7.0)
Neutrophils: 58 %
Platelets: 250 10*3/uL (ref 150–450)
RBC: 4.26 x10E6/uL (ref 3.77–5.28)
RDW: 16.4 % — ABNORMAL HIGH (ref 11.7–15.4)
WBC: 5.3 10*3/uL (ref 3.4–10.8)

## 2021-06-10 NOTE — Telephone Encounter (Signed)
medcost sent to GI they obtain auth and call patient to schedule

## 2021-06-17 ENCOUNTER — Telehealth: Payer: Self-pay | Admitting: Neurology

## 2021-06-17 NOTE — Telephone Encounter (Signed)
She had Mohs surgery for skin cancer 01/13/2021.  Pathology came back consistent with basal cell carcinoma.  The cancer was completely removed.

## 2021-06-17 NOTE — Telephone Encounter (Signed)
Faxed medical records request to [phone]. Received records back from [facility] gave to POD 1 in Dr. Bonnita Hollow review box

## 2021-06-23 ENCOUNTER — Ambulatory Visit (INDEPENDENT_AMBULATORY_CARE_PROVIDER_SITE_OTHER): Payer: No Typology Code available for payment source | Admitting: Family Medicine

## 2021-06-23 VITALS — BP 130/88 | HR 99 | Temp 98.7°F | Wt 153.8 lb

## 2021-06-23 DIAGNOSIS — N3001 Acute cystitis with hematuria: Secondary | ICD-10-CM | POA: Diagnosis not present

## 2021-06-23 DIAGNOSIS — K2930 Chronic superficial gastritis without bleeding: Secondary | ICD-10-CM | POA: Insufficient documentation

## 2021-06-23 DIAGNOSIS — R300 Dysuria: Secondary | ICD-10-CM | POA: Diagnosis not present

## 2021-06-23 DIAGNOSIS — D649 Anemia, unspecified: Secondary | ICD-10-CM | POA: Insufficient documentation

## 2021-06-23 HISTORY — DX: Chronic superficial gastritis without bleeding: K29.30

## 2021-06-23 LAB — POCT URINALYSIS DIPSTICK
Bilirubin, UA: NEGATIVE
Glucose, UA: NEGATIVE
Ketones, UA: NEGATIVE
Nitrite, UA: NEGATIVE
Protein, UA: POSITIVE — AB
Spec Grav, UA: <=1.005 — AB (ref 1.010–1.025)
Urobilinogen, UA: 0.2 E.U./dL
pH, UA: 6 (ref 5.0–8.0)

## 2021-06-23 MED ORDER — CEPHALEXIN 500 MG PO CAPS: 500.0000 mg | ORAL_CAPSULE | Freq: Three times a day (TID) | ORAL | 0 refills | Status: DC

## 2021-06-23 NOTE — Progress Notes (Signed)
Taylor Clements , 06-Dec-1970, 51 y.o., female MRN: [id number] Patient Care Team    Relationship Specialty Notifications Start End  Natalia Leatherwood, DO PCP - General Family Medicine  11/21/17     Chief Complaint  Patient presents with   Dysuria    Started yesterday. Pt is currently on cycle.     Subjective: Pt presents for an OV with complaints of dysuria that started yesterday morning.  She also felt mildly feverish this morning.  She denies chills, nausea, vomit or diarrhea.  She is currently on her menstrual cycle.  She has not had a urinary tract infection in the past.     04/28/2020    7:59 AM 03/12/2019    9:26 AM 12/27/2017    9:11 AM 11/21/2017    1:57 PM  Depression screen PHQ 2/9  Decreased Interest 0 0 0 0  Down, Depressed, Hopeless 0 0 0 0  PHQ - 2 Score 0 0 0 0  Altered sleeping   0   Tired, decreased energy   1   Change in appetite   0   Feeling bad or failure about yourself    0   Trouble concentrating   0   Moving slowly or fidgety/restless   0   Suicidal thoughts   0   PHQ-9 Score   1   Difficult doing work/chores   Not difficult at all     No Known Allergies Social History   Social History Narrative   Marital status/children/pets: married, 1 child   Safety:      -smoke alarm in the home:Yes     - wears seatbelt: Yes     - Feels safe in their relationships: Yes      Right handed    Caffeine use: tea daily   Past Medical History:  Diagnosis Date   Actinic keratitis 12/07/2016   shaved, deep margins- pt declined further biospy, aldara and  Excel V laser completed by derm   Allergy    Anemia    Chicken pox    Idiopathic chronic venous hypertension of both legs with inflammation    Menorrhagia    control on BCP   Multiple sclerosis ([facility]) 2005   diagnosed 2005; after optic Neuritis. Has IBS, fatiue and right arm paresthesia w/ flares   Optic neuritis due to multiple sclerosis ([facility]) 2007   loss vision; IV steroids --> vision returned.     Phobia, flying    uses ativan with flights   Post partum depression    Venous insufficiency (chronic) (peripheral)    Past Surgical History:  Procedure Laterality Date   DILATION AND EVACUATION  2003   nonviable pregnancy   image: duplex LLE Left 05/16/2016   scanned into media.    Image: MRI Brain  07/10/2017   Stable minor subcortical & periventricular white matter changes; MS   LASER ABLATION Left 2014   left greater saphenous vein, then endoevous chemical ablation and sclerotherpay.    procedure: Excel V laser  12/07/2016   Ak tip of nose shaved-deep margins-pt declined deeper bx, aldara cream and laser completed by derm.    Family History  Problem Relation Age of Onset   Breast cancer Mother    Asthma Brother    Depression Brother    Drug abuse Brother    Learning disabilities Brother    Stroke Maternal Grandmother    Colon cancer Neg Hx    Esophageal cancer Neg Hx  Pancreatic cancer Neg Hx    Stomach cancer Neg Hx    Rectal cancer Neg Hx    Allergies as of 06/23/2021   No Known Allergies      Medication List        Accurate as of Jun 23, 2021  4:45 PM. If you have any questions, ask your nurse or doctor.          Calcium 500-125 MG-UNIT Tabs Take 2 tablets by mouth daily.   cephALEXin 500 MG capsule Commonly known as: KEFLEX Take 1 capsule (500 mg total) by mouth 3 (three) times daily. Started by: Felix Pacini, DO   Fish Oil 1200 MG Caps Take 1 capsule by mouth 2 (two) times daily.   hyoscyamine 0.125 MG SL tablet Commonly known as: LEVSIN SL Place 1 tablet (0.125 mg total) under the tongue every 4 (four) hours as needed.   loratadine 10 MG tablet Commonly known as: CLARITIN Take 10 mg by mouth daily.   LORazepam 1 MG tablet Commonly known as: ATIVAN Take 1 tablet (1 mg total) by mouth every 12 (twelve) hours as needed for anxiety.   Mavenclad (7 Tabs) 10 MG Tbpk Generic drug: Cladribine (7 Tabs) Take by mouth. Year 1 Month 1 and 2:  Days 1-2: 2 tabs per day and Days 3-5: 1 tab per day to equal 7 tabs total   metroNIDAZOLE 0.75 % cream Commonly known as: METROCREAM Apply 1 application topically 2 (two) times daily.   multivitamin capsule Take 1 capsule by mouth daily.   Olopatadine HCl 0.2 % Soln Apply 1 drop to eye daily.   Polysaccharide-Iron Complex 150 MG Caps Take 1 capsule by mouth daily.   tranexamic acid 650 MG Tabs tablet Commonly known as: LYSTEDA Take 1,300 mg by mouth 3 (three) times daily.   triamcinolone 55 MCG/ACT Aero nasal inhaler Commonly known as: NASACORT Place 2 sprays into the nose daily.   Vitamin C 500 MG Caps Take 1 tablet by mouth daily.        All past medical history, surgical history, allergies, family history, immunizations andmedications were updated in the EMR today and reviewed under the history and medication portions of their EMR.     Review of Systems  Constitutional:  Positive for fever. Negative for chills.  Gastrointestinal:  Positive for abdominal pain. Negative for diarrhea, nausea and vomiting.  Genitourinary:  Positive for dysuria, frequency and urgency. Negative for flank pain.  Neurological:  Negative for dizziness.  Negative, with the exception of above mentioned in HPI   Objective:  BP 130/88   Pulse 99   Temp 98.7 F (37.1 C)   Wt 153 lb 12.8 oz (69.8 kg)   SpO2 100%   BMI 25.99 kg/m  Body mass index is 25.99 kg/m. Physical Exam Vitals and nursing note reviewed.  Constitutional:      General: She is not in acute distress.    Appearance: Normal appearance. She is normal weight. She is not ill-appearing or toxic-appearing.  HENT:     Head: Normocephalic and atraumatic.  Eyes:     Extraocular Movements: Extraocular movements intact.     Conjunctiva/sclera: Conjunctivae normal.     Pupils: Pupils are equal, round, and reactive to light.  Abdominal:     General: Abdomen is flat. There is no distension.     Palpations: Abdomen is soft.      Tenderness: There is no abdominal tenderness. There is no right CVA tenderness or left CVA tenderness.  Neurological:  Mental Status: She is alert and oriented to person, place, and time. Mental status is at baseline.  Psychiatric:        Mood and Affect: Mood normal.        Behavior: Behavior normal.        Thought Content: Thought content normal.        Judgment: Judgment normal.     No results found. No results found. Results for orders placed or performed in visit on 06/23/21 (from the past 24 hour(s))  POCT urinalysis dipstick     Status: Abnormal   Collection Time: 06/23/21  4:10 PM  Result Value Ref Range   Color, UA pale yellow    Clarity, UA cloudy    Glucose, UA Negative Negative   Bilirubin, UA neg    Ketones, UA neg    Spec Grav, UA <=1.005 (A) 1.010 - 1.025   Blood, UA 3+    pH, UA 6.0 5.0 - 8.0   Protein, UA Positive (A) Negative   Urobilinogen, UA 0.2 0.2 or 1.0 E.U./dL   Nitrite, UA neg    Leukocytes, UA Large (3+) (A) Negative   Appearance     Odor      Assessment/Plan: Taylor Clements is a 51 y.o. female present for OV for  Dysuria/Acute cystitis with hematuria - POCT urinalysis dipstick - Urinalysis w microscopic + reflex cultur Point-of-care urinalysis appears infectious today.  She did have a low-grade fever when she woke up this morning.  Hematuria is most likely contamination from her menstrual cycle. Elected to start prophylactic treatment with Keflex 3 times daily x7 days Patient was encouraged to hydrate Urine culture was sent.  Patient will be called with culture results and discuss further plan at that time.  Reviewed expectations re: course of current medical issues. Discussed self-management of symptoms. Outlined signs and symptoms indicating need for more acute intervention. Patient verbalized understanding and all questions were answered. Patient received an After-Visit Summary.    Orders Placed This Encounter  Procedures    Urinalysis w microscopic + reflex cultur   POCT urinalysis dipstick   Meds ordered this encounter  Medications   cephALEXin (KEFLEX) 500 MG capsule    Sig: Take 1 capsule (500 mg total) by mouth 3 (three) times daily.    Dispense:  21 capsule    Refill:  0   Referral Orders  No referral(s) requested today     Note is dictated utilizing voice recognition software. Although note has been proof read prior to signing, occasional typographical errors still can be missed. If any questions arise, please do not hesitate to call for verification.   electronically signed by:  Felix Pacini, DO  [facility] [place]

## 2021-06-23 NOTE — Patient Instructions (Signed)
Urinary Tract Infection, Adult A urinary tract infection (UTI) is an infection of any part of the urinary tract. The urinary tract includes: The kidneys. The ureters. The bladder. The urethra. These organs make, store, and get rid of pee (urine) in the body. What are the causes? This infection is caused by germs (bacteria) in your genital area. These germs grow and cause swelling (inflammation) of your urinary tract. What increases the risk? The following factors may make you more likely to develop this condition: Using a small, thin tube (catheter) to drain pee. Not being able to control when you pee or poop (incontinence). Being female. If you are female, these things can increase the risk: Using these methods to prevent pregnancy: A medicine that kills sperm (spermicide). A device that blocks sperm (diaphragm). Having low levels of a female hormone (estrogen). Being pregnant. You are more likely to develop this condition if: You have genes that add to your risk. You are sexually active. You take antibiotic medicines. You have trouble peeing because of: A prostate that is bigger than normal, if you are female. A blockage in the part of your body that drains pee from the bladder. A kidney stone. A nerve condition that affects your bladder. Not getting enough to drink. Not peeing often enough. You have other conditions, such as: Diabetes. A weak disease-fighting system (immune system). Sickle cell disease. Gout. Injury of the spine. What are the signs or symptoms? Symptoms of this condition include: Needing to pee right away. Peeing small amounts often. Pain or burning when peeing. Blood in the pee. Pee that smells bad or not like normal. Trouble peeing. Pee that is cloudy. Fluid coming from the vagina, if you are female. Pain in the belly or lower back. Other symptoms include: Vomiting. Not feeling hungry. Feeling mixed up (confused). This may be the first symptom in  older adults. Being tired and grouchy (irritable). A fever. Watery poop (diarrhea). How is this treated? Taking antibiotic medicine. Taking other medicines. Drinking enough water. In some cases, you may need to see a specialist. Follow these instructions at home:  Medicines Take over-the-counter and prescription medicines only as told by your doctor. If you were prescribed an antibiotic medicine, take it as told by your doctor. Do not stop taking it even if you start to feel better. General instructions Make sure you: Pee until your bladder is empty. Do not hold pee for a long time. Empty your bladder after sex. Wipe from front to back after peeing or pooping if you are a female. Use each tissue one time when you wipe. Drink enough fluid to keep your pee pale yellow. Keep all follow-up visits. Contact a doctor if: You do not get better after 1-2 days. Your symptoms go away and then come back. Get help right away if: You have very bad back pain. You have very bad pain in your lower belly. You have a fever. You have chills. You feeling like you will vomit or you vomit. Summary A urinary tract infection (UTI) is an infection of any part of the urinary tract. This condition is caused by germs in your genital area. There are many risk factors for a UTI. Treatment includes antibiotic medicines. Drink enough fluid to keep your pee pale yellow. This information is not intended to replace advice given to you by your health care provider. Make sure you discuss any questions you have with your health care provider. Document Revised: 08/23/2019 Document Reviewed: 08/23/2019 Elsevier Patient Education    2023 Elsevier Inc.  

## 2021-06-26 ENCOUNTER — Ambulatory Visit
Admission: RE | Admit: 2021-06-26 | Discharge: 2021-06-26 | Disposition: A | Discharge status: Home / Self Care | Payer: No Typology Code available for payment source | Source: Ambulatory Visit | Attending: Neurology | Admitting: Neurology

## 2021-06-26 DIAGNOSIS — G35 Multiple sclerosis: Secondary | ICD-10-CM

## 2021-06-26 LAB — URINALYSIS W MICROSCOPIC + REFLEX CULTURE
Bilirubin Urine: NEGATIVE
Glucose, UA: NEGATIVE
Hyaline Cast: NONE SEEN /LPF
Ketones, ur: NEGATIVE
Nitrites, Initial: NEGATIVE
Protein, ur: NEGATIVE
Specific Gravity, Urine: 1.004 (ref 1.001–1.035)
Squamous Epithelial / HPF: NONE SEEN /HPF (ref <=5)
WBC, UA: >=60 /HPF — AB (ref 0–5)
pH: 6 (ref 5.0–8.0)

## 2021-06-26 LAB — CULTURE INDICATED

## 2021-06-26 LAB — URINE CULTURE
MICRO NUMBER:: 13470154
SPECIMEN QUALITY:: ADEQUATE

## 2021-06-26 MED ORDER — GADOBENATE DIMEGLUMINE 529 MG/ML IV SOLN
14.0000 mL | Freq: Once | INTRAVENOUS | Status: AC | PRN
  Administered 2021-06-26: 14 mL via INTRAVENOUS

## 2021-07-13 ENCOUNTER — Ambulatory Visit: Payer: No Typology Code available for payment source | Admitting: Neurology

## 2021-09-09 IMAGING — MG MM DIGITAL SCREENING BILAT W/ TOMO AND CAD
8 series · 9 of 24 positions shown · non-contrast
Comparison: Previous exam(s).

CLINICAL DATA: Screening.

EXAM:
DIGITAL SCREENING BILATERAL MAMMOGRAM WITH TOMOSYNTHESIS AND CAD
TECHNIQUE: Bilateral screening digital craniocaudal and mediolateral oblique
mammograms were obtained. Bilateral screening digital breast
tomosynthesis was performed. The images were evaluated with
computer-aided detection.

[L MLO synth-2D]
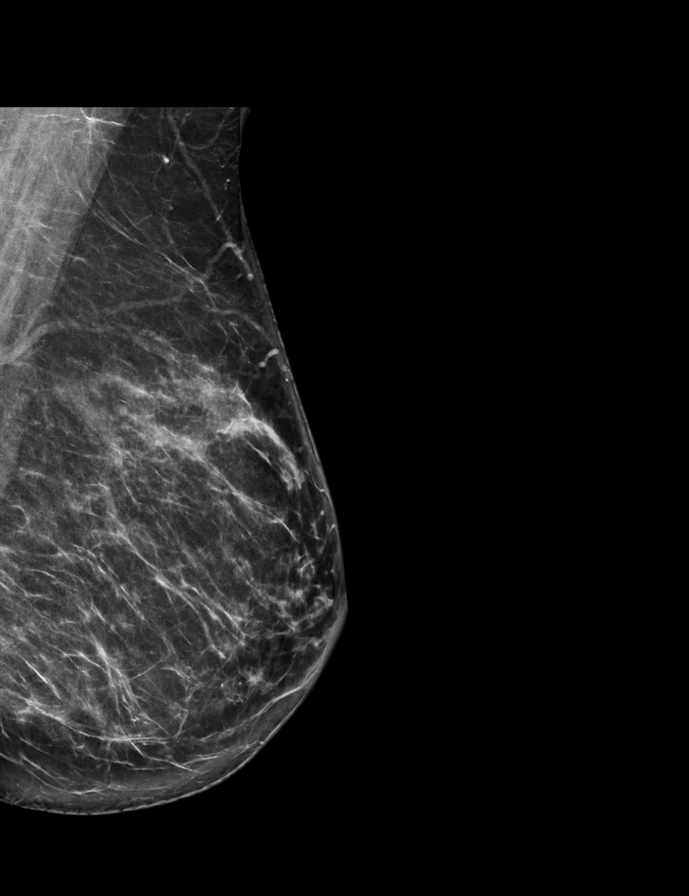

[L CC synth-2D]
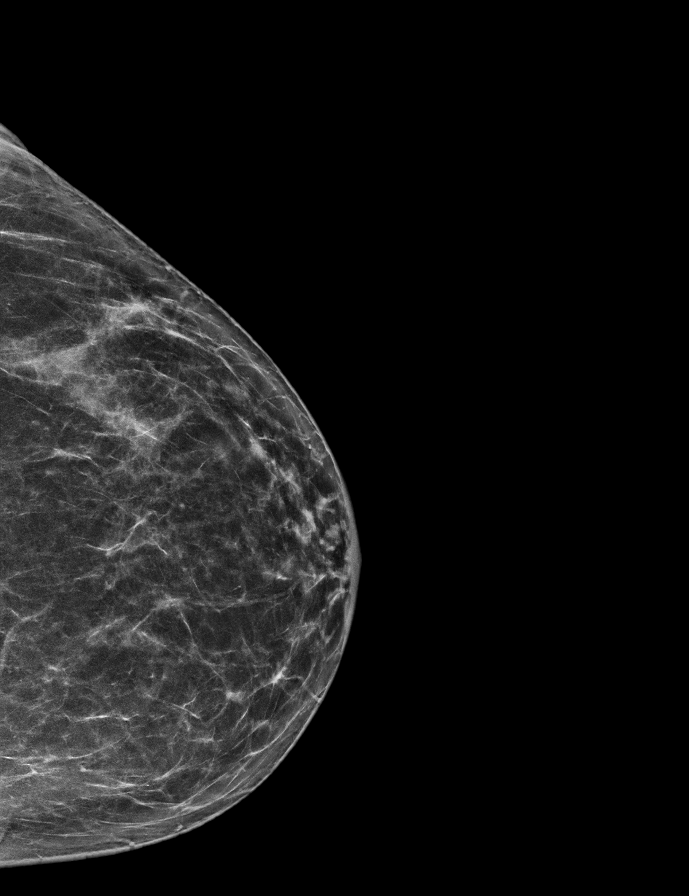

[R CC synth-2D]
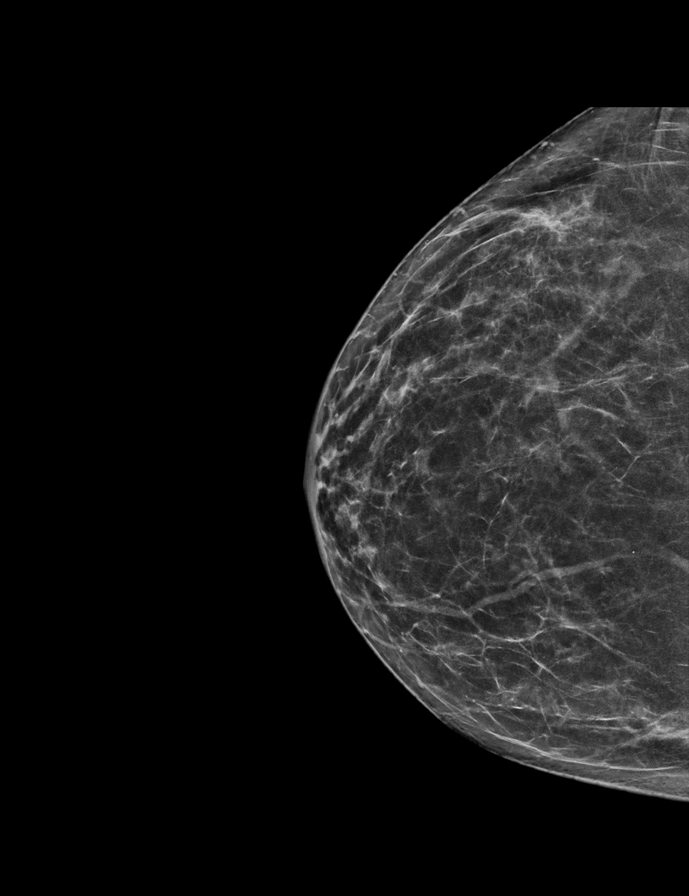

[R MLO synth-2D]
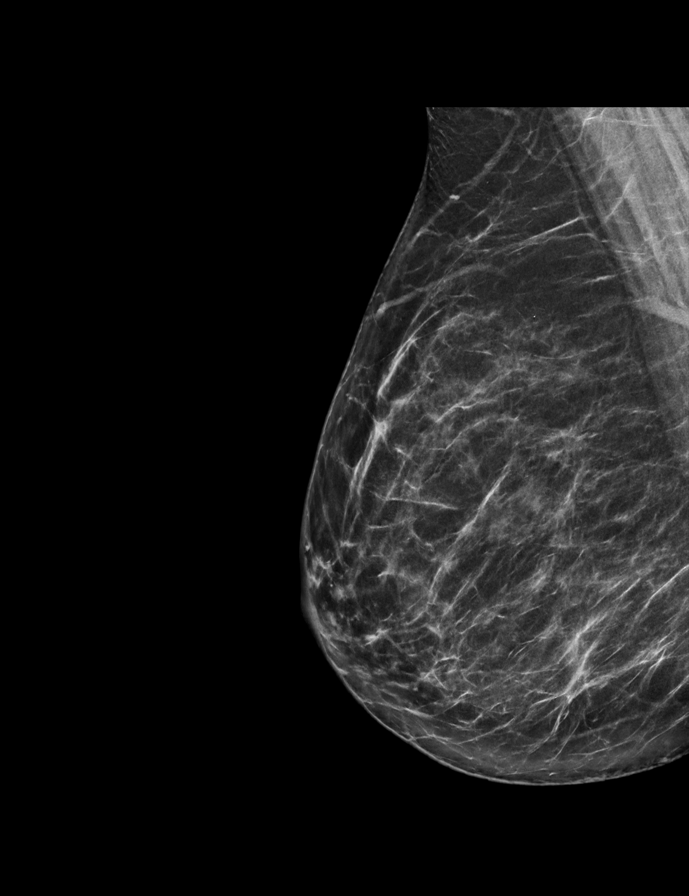

[L MLO tomo · 2 of 72 frames shown]
[frame 24/72]
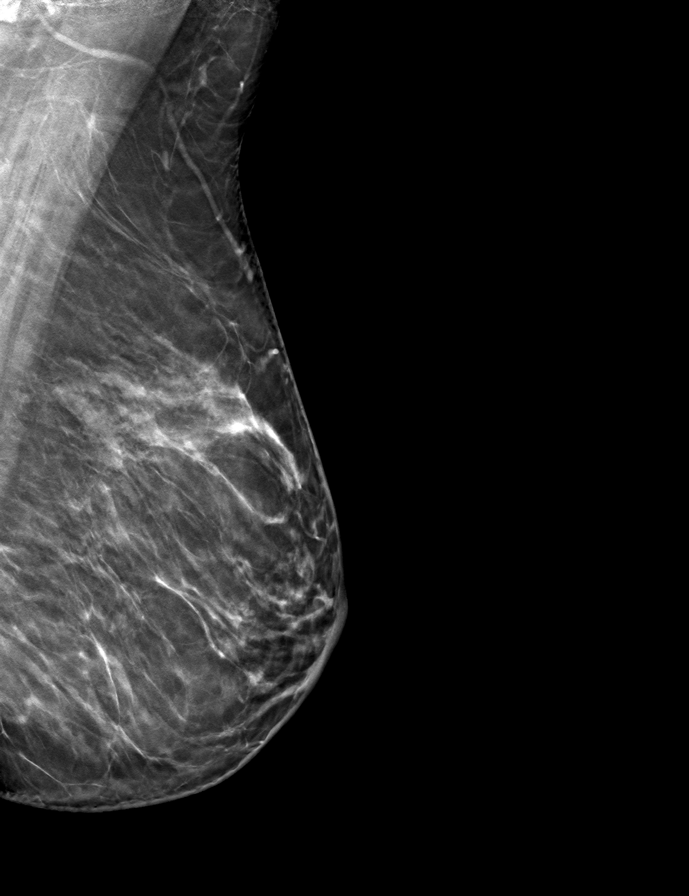
[frame 37/72]
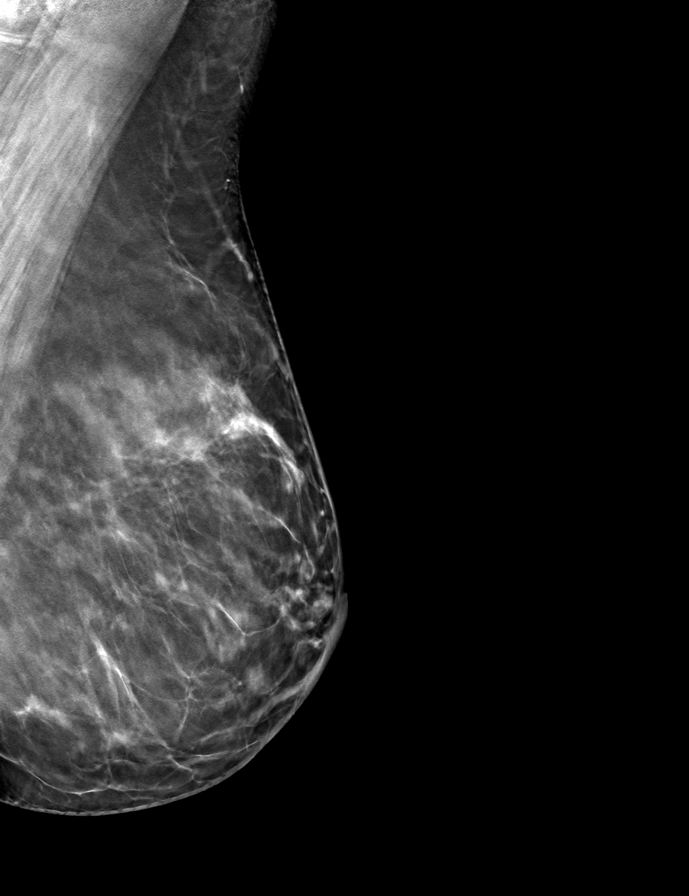

[L CC tomo · tomo slice 33/65.0]
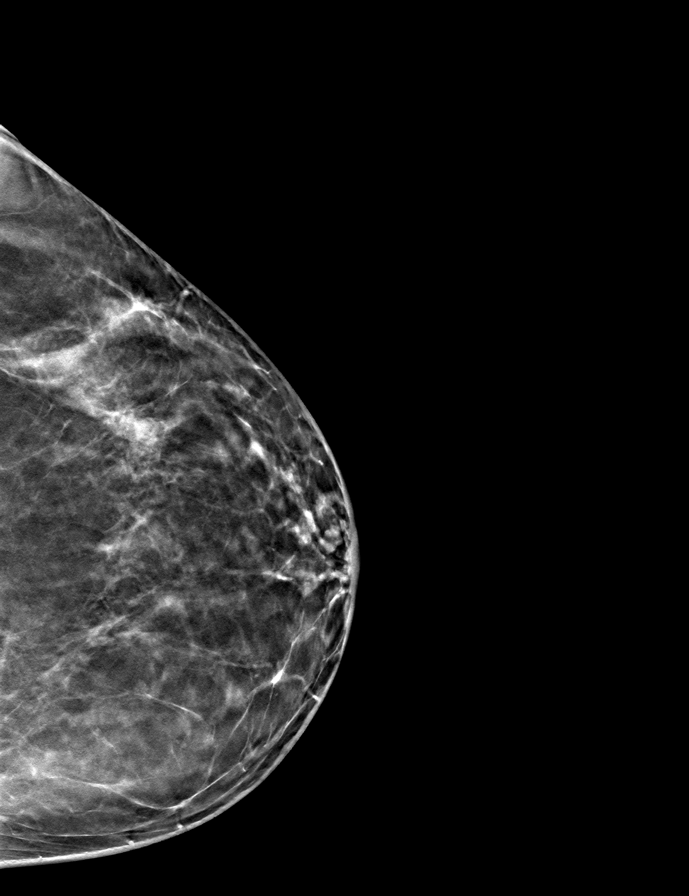

[R MLO tomo · tomo slice 35/68.0]
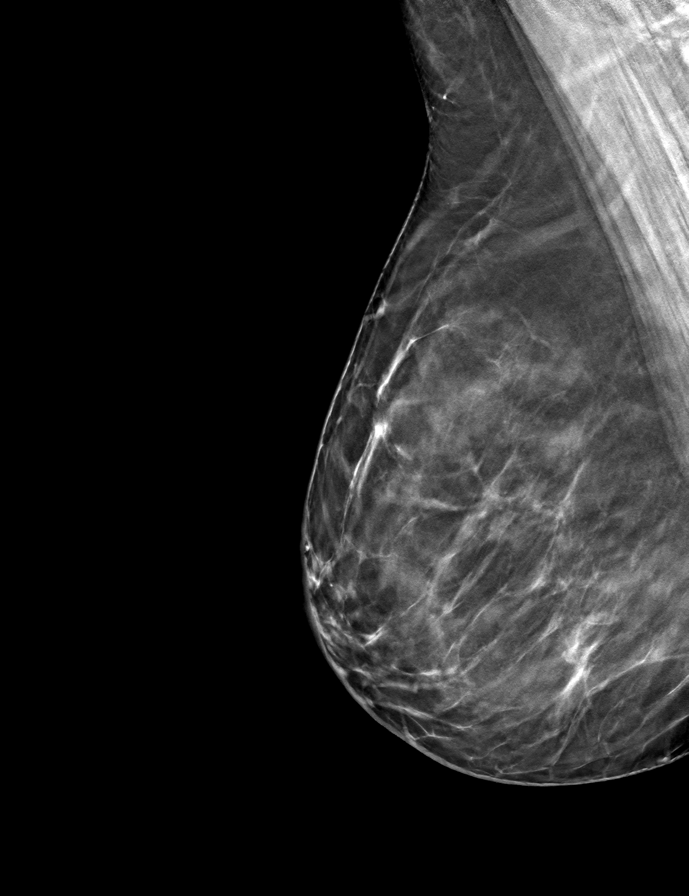

[R CC tomo · tomo slice 31/61.0]
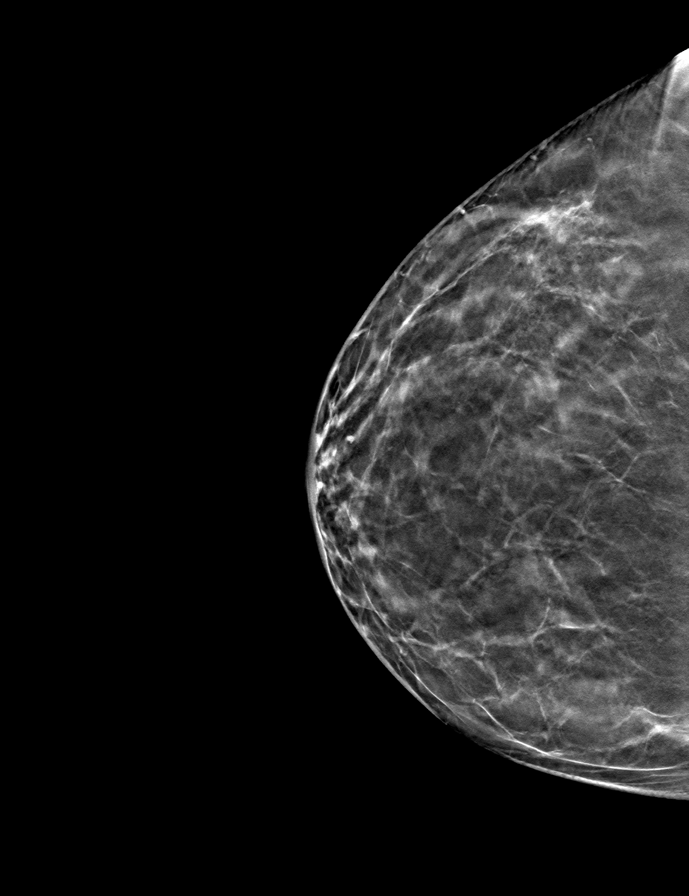

[9 of 24 positions shown; findings below may reference images not displayed]

ACR Breast Density Category b: There are scattered areas of
fibroglandular density.
FINDINGS: There are no findings suspicious for malignancy.
IMPRESSION: No mammographic evidence of malignancy. A result letter of this
screening mammogram will be mailed directly to the patient.

RECOMMENDATION:
Screening mammogram in one year. (Code:51-O-LD2)

BI-RADS CATEGORY  1: Negative.

## 2021-09-23 ENCOUNTER — Other Ambulatory Visit: Payer: Self-pay | Admitting: Family Medicine

## 2021-10-22 ENCOUNTER — Encounter: Payer: Self-pay | Admitting: Family Medicine

## 2021-10-22 ENCOUNTER — Ambulatory Visit (INDEPENDENT_AMBULATORY_CARE_PROVIDER_SITE_OTHER): Payer: No Typology Code available for payment source | Admitting: Family Medicine

## 2021-10-22 VITALS — BP 111/72 | HR 90 | Temp 98.2°F | Ht 64.0 in | Wt 154.0 lb

## 2021-10-22 DIAGNOSIS — Z0000 Encounter for general adult medical examination without abnormal findings: Secondary | ICD-10-CM | POA: Diagnosis not present

## 2021-10-22 DIAGNOSIS — Z79899 Other long term (current) drug therapy: Secondary | ICD-10-CM | POA: Diagnosis not present

## 2021-10-22 DIAGNOSIS — E781 Pure hyperglyceridemia: Secondary | ICD-10-CM | POA: Diagnosis not present

## 2021-10-22 DIAGNOSIS — Z1231 Encounter for screening mammogram for malignant neoplasm of breast: Secondary | ICD-10-CM

## 2021-10-22 DIAGNOSIS — G35 Multiple sclerosis: Secondary | ICD-10-CM

## 2021-10-22 DIAGNOSIS — Z23 Encounter for immunization: Secondary | ICD-10-CM

## 2021-10-22 MED ORDER — POLYSACCHARIDE IRON COMPLEX 150 MG PO CAPS
150.0000 mg | ORAL_CAPSULE | Freq: Every day | ORAL | 3 refills | Status: DC
  Filled 2022-01-18 – 2022-01-19 (×2): qty 90, 90d supply, fill #0
  Filled 2022-04-09: qty 90, 90d supply, fill #1
  Filled 2022-07-11: qty 90, 90d supply, fill #2

## 2021-10-22 NOTE — Patient Instructions (Signed)
No follow-ups on file.        Great to see you today.  I have refilled the medication(s) we provide.   If labs were collected, we will inform you of lab results once received either by echart message or telephone call.   - echart message- for normal results that have been seen by the patient already.   - telephone call: abnormal results or if patient has not viewed results in their echart.  Health Maintenance, Female Adopting a healthy lifestyle and getting preventive care are important in promoting health and wellness. Ask your health care provider about: The right schedule for you to have regular tests and exams. Things you can do on your own to prevent diseases and keep yourself healthy. What should I know about diet, weight, and exercise? Eat a healthy diet  Eat a diet that includes plenty of vegetables, fruits, low-fat dairy products, and lean protein. Do not eat a lot of foods that are high in solid fats, added sugars, or sodium. Maintain a healthy weight Body mass index (BMI) is used to identify weight problems. It estimates body fat based on height and weight. Your health care provider can help determine your BMI and help you achieve or maintain a healthy weight. Get regular exercise Get regular exercise. This is one of the most important things you can do for your health. Most adults should: Exercise for at least 150 minutes each week. The exercise should increase your heart rate and make you sweat (moderate-intensity exercise). Do strengthening exercises at least twice a week. This is in addition to the moderate-intensity exercise. Spend less time sitting. Even light physical activity can be beneficial. Watch cholesterol and blood lipids Have your blood tested for lipids and cholesterol at 51 years of age, then have this test every 5 years. Have your cholesterol levels checked more often if: Your lipid or cholesterol levels are high. You are older than 51 years of  age. You are at high risk for heart disease. What should I know about cancer screening? Depending on your health history and family history, you may need to have cancer screening at various ages. This may include screening for: Breast cancer. Cervical cancer. Colorectal cancer. Skin cancer. Lung cancer. What should I know about heart disease, diabetes, and high blood pressure? Blood pressure and heart disease High blood pressure causes heart disease and increases the risk of stroke. This is more likely to develop in people who have high blood pressure readings or are overweight. Have your blood pressure checked: Every 3-5 years if you are 18-39 years of age. Every year if you are 40 years old or older. Diabetes Have regular diabetes screenings. This checks your fasting blood sugar level. Have the screening done: Once every three years after age 40 if you are at a normal weight and have a low risk for diabetes. More often and at a younger age if you are overweight or have a high risk for diabetes. What should I know about preventing infection? Hepatitis B If you have a higher risk for hepatitis B, you should be screened for this virus. Talk with your health care provider to find out if you are at risk for hepatitis B infection. Hepatitis C Testing is recommended for: Everyone born from 1945 through 1965. Anyone with known risk factors for hepatitis C. Sexually transmitted infections (STIs) Get screened for STIs, including gonorrhea and chlamydia, if: You are sexually active and are younger than 51 years of age. You are   older than 51 years of age and your health care provider tells you that you are at risk for this type of infection. Your sexual activity has changed since you were last screened, and you are at increased risk for chlamydia or gonorrhea. Ask your health care provider if you are at risk. Ask your health care provider about whether you are at high risk for HIV. Your health  care provider may recommend a prescription medicine to help prevent HIV infection. If you choose to take medicine to prevent HIV, you should first get tested for HIV. You should then be tested every 3 months for as long as you are taking the medicine. Pregnancy If you are about to stop having your period (premenopausal) and you may become pregnant, seek counseling before you get pregnant. Take 400 to 800 micrograms (mcg) of folic acid every day if you become pregnant. Ask for birth control (contraception) if you want to prevent pregnancy. Osteoporosis and menopause Osteoporosis is a disease in which the bones lose minerals and strength with aging. This can result in bone fractures. If you are 65 years old or older, or if you are at risk for osteoporosis and fractures, ask your health care provider if you should: Be screened for bone loss. Take a calcium or vitamin D supplement to lower your risk of fractures. Be given hormone replacement therapy (HRT) to treat symptoms of menopause. Follow these instructions at home: Alcohol use Do not drink alcohol if: Your health care provider tells you not to drink. You are pregnant, may be pregnant, or are planning to become pregnant. If you drink alcohol: Limit how much you have to: 0-1 drink a day. Know how much alcohol is in your drink. In the [place] one drink equals one 12 oz bottle of beer (355 mL), one 5 oz glass of wine (148 mL), or one 1 oz glass of hard liquor (44 mL). Lifestyle Do not use any products that contain nicotine or tobacco. These products include cigarettes, chewing tobacco, and vaping devices, such as e-cigarettes. If you need help quitting, ask your health care provider. Do not use street drugs. Do not share needles. Ask your health care provider for help if you need support or information about quitting drugs. General instructions Schedule regular health, dental, and eye exams. Stay current with your vaccines. Tell your health  care provider if: You often feel depressed. You have ever been abused or do not feel safe at home. Summary Adopting a healthy lifestyle and getting preventive care are important in promoting health and wellness. Follow your health care provider's instructions about healthy diet, exercising, and getting tested or screened for diseases. Follow your health care provider's instructions on monitoring your cholesterol and blood pressure. This information is not intended to replace advice given to you by your health care provider. Make sure you discuss any questions you have with your health care provider. Document Revised: 06/01/2020 Document Reviewed: 06/01/2020 Elsevier Patient Education  2023 Elsevier Inc.  

## 2021-10-22 NOTE — Progress Notes (Signed)
Patient ID: Taylor Clements, female  DOB: [birth year], 51 y.o.   MRN: [id number] Patient Care Team    Relationship Specialty Notifications Start End  Natalia Leatherwood, DO PCP - General Family Medicine  11/21/17   Lavonna Monarch, FNP  Nurse Practitioner  10/22/21     Chief Complaint  Patient presents with   Annual Exam    Pt is not fasting    Subjective: Taylor Clements is a 51 y.o.  Female  present for CPE All past medical history, surgical history, allergies, family history, immunizations, medications and social history were updated in the electronic medical record today. All recent labs, ED visits and hospitalizations within the last year were reviewed.  Health maintenance:  Colonoscopy: No fhx; COMPLETED 09/2020 Mammogram: completed: FHX mother, completed 06/2020, birads 1. @ [place]- [facility]> order placed Cervical cancer screening: last pap: 03/23/2021- Dr. Erling Cruz Immunizations: tdap 2015, Influenza declined (encouraged yearly). Shingrix declined Infectious disease screening: HIV completed, Hep C completed  DEXA: routine screen, no chronic steroid with MS  Assistive device: none Oxygen BSW:HQPR Patient has a Dental home. Hospitalizations/ED visits: reviewed      10/22/2021    3:43 PM 04/28/2020    7:59 AM 03/12/2019    9:26 AM 12/27/2017    9:11 AM 11/21/2017    1:57 PM  Depression screen PHQ 2/9  Decreased Interest 0 0 0 0 0  Down, Depressed, Hopeless 0 0 0 0 0  PHQ - 2 Score 0 0 0 0 0  Altered sleeping    0   Tired, decreased energy    1   Change in appetite    0   Feeling bad or failure about yourself     0   Trouble concentrating    0   Moving slowly or fidgety/restless    0   Suicidal thoughts    0   PHQ-9 Score    1   Difficult doing work/chores    Not difficult at all       12/27/2017    9:12 AM  GAD 7 : Generalized Anxiety Score  Nervous, Anxious, on Edge 0  Control/stop worrying 0  Worry too much - different things 0  Trouble relaxing 0   Restless 0  Easily annoyed or irritable 1  Afraid - awful might happen 0  Total GAD 7 Score 1  Anxiety Difficulty Not difficult at all   Immunization History  Administered Date(s) Administered   Influenza Split 12/01/2019, 11/21/2020   Influenza,inj,Quad PF,6+ Mos 12/27/2017   Influenza-Unspecified 10/25/2018, 11/07/2020   PFIZER(Purple Top)SARS-COV-2 Vaccination 04/13/2019, 05/04/2019   Tdap 06/24/2013   Past Medical History:  Diagnosis Date   Actinic keratitis 12/07/2016   shaved, deep margins- pt declined further biospy, aldara and  Excel V laser completed by derm   Allergy    Anemia    Chicken pox    Idiopathic chronic venous hypertension of both legs with inflammation    Menorrhagia    control on BCP   Multiple sclerosis (HCC) 2005   diagnosed 2005; after optic Neuritis. Has IBS, fatiue and right arm paresthesia w/ flares   Optic neuritis due to multiple sclerosis (HCC) 2007   loss vision; IV steroids --> vision returned.    Phobia, flying    uses ativan with flights   Post partum depression    Venous insufficiency (chronic) (peripheral)    No Known Allergies Past Surgical History:  Procedure Laterality Date   DILATION AND EVACUATION  2003   nonviable pregnancy   image: duplex LLE Left 05/16/2016   scanned into media.    Image: MRI Brain  07/10/2017   Stable minor subcortical & periventricular white matter changes; MS   LASER ABLATION Left 2014   left greater saphenous vein, then endoevous chemical ablation and sclerotherpay.    MOHS SURGERY  12/2020   procedure: Excel V laser  12/07/2016   Ak tip of nose shaved-deep margins-pt declined deeper bx, aldara cream and laser completed by derm.    Family History  Problem Relation Age of Onset   Breast cancer Mother    Asthma Brother    Depression Brother    Drug abuse Brother    Learning disabilities Brother    Stroke Maternal Grandmother    Colon cancer Neg Hx    Esophageal cancer Neg Hx    Pancreatic  cancer Neg Hx    Stomach cancer Neg Hx    Rectal cancer Neg Hx    Social History   Social History Narrative   Marital status/children/pets: married, 1 child   Safety:      -smoke alarm in the home:Yes     - wears seatbelt: Yes     - Feels safe in their relationships: Yes      Right handed    Caffeine use: tea daily    Allergies as of 10/22/2021   No Known Allergies      Medication List        Accurate as of October 22, 2021  4:13 PM. If you have any questions, ask your nurse or doctor.          STOP taking these medications    cephALEXin 500 MG capsule Commonly known as: KEFLEX Stopped by: Felix Pacini, DO   LORazepam 1 MG tablet Commonly known as: ATIVAN Stopped by: Felix Pacini, DO   tranexamic acid 650 MG Tabs tablet Commonly known as: LYSTEDA Stopped by: Felix Pacini, DO       TAKE these medications    Calcium 500-125 MG-UNIT Tabs Take 2 tablets by mouth daily.   Fish Oil 1200 MG Caps Take 1 capsule by mouth 2 (two) times daily.   hyoscyamine 0.125 MG SL tablet Commonly known as: LEVSIN SL Place 1 tablet (0.125 mg total) under the tongue every 4 (four) hours as needed.   iron polysaccharides 150 MG capsule Commonly known as: NIFEREX Take 1 capsule (150 mg total) by mouth daily. What changed: how much to take Changed by: Felix Pacini, DO   loratadine 10 MG tablet Commonly known as: CLARITIN Take 10 mg by mouth daily.   Mavenclad (7 Tabs) 10 MG Tbpk Generic drug: Cladribine (7 Tabs) Take by mouth. Year 1 Month 1 and 2: Days 1-2: 2 tabs per day and Days 3-5: 1 tab per day to equal 7 tabs total   metroNIDAZOLE 0.75 % cream Commonly known as: METROCREAM Apply 1 application topically 2 (two) times daily.   multivitamin capsule Take 1 capsule by mouth daily.   Olopatadine HCl 0.2 % Soln Apply 1 drop to eye daily.   triamcinolone 55 MCG/ACT Aero nasal inhaler Commonly known as: NASACORT Place 2 sprays into the nose daily.    Vitamin C 500 MG Caps Take 1 tablet by mouth daily.        All past medical history, surgical history, allergies, family history, immunizations andmedications were updated in the EMR today and reviewed under the history and medication portions of their EMR.  No results found for this or any previous visit (from the past 2160 hour(s)).  MR BRAIN W WO CONTRAST  Result Date: 06/26/2021  [facility] [address] [place], [place] [address] [phone] NEUROIMAGING REPORT STUDY DATE: 06/26/2021 PATIENT NAME: Taylor Clements DOB: 08-Aug-1970 MRN: [id number] EXAM: MRI Brain with and without contrast ORDERING CLINICIAN: Richard A. Sater, MD. PhD CLINICAL HISTORY: 51 year old woman with multiple sclerosis COMPARISON FILMS: 05/23/2020 TECHNIQUE:MRI of the brain with and without contrast was obtained utilizing 5 mm axial slices with T1, T2, T2 flair, SWI and diffusion weighted views.  T1 sagittal, T2 coronal and postcontrast views in the axial and coronal plane were obtained.  CONTRAST: 14 ml Multihance IMAGING SITE: [facility], [address] [place], [place], [place] FINDINGS: On sagittal images, the spinal cord is imaged caudally to C4 and is normal in caliber.   The contents of the posterior fossa are of normal size and position.   The pituitary gland and optic chiasm appear normal.    Brain volume appears normal.   The ventricles are normal in size and without distortion.  There are no abnormal extra-axial collections of fluid.  There are a small number of T2/FLAIR hyperintense foci, 2 in the periventricular white matter, 1 in the juxtacortical white matter and the rest in the deep white matter.  None of these appear to be acute.  They do not enhance.  Compared to the MRI from 05/23/2020, there are no new lesions.  The cerebellum and brainstem appears normal.   The deep gray matter appears normal.    Diffusion weighted images are normal.  Susceptibility weighted images are  normal.   The orbits appear normal.   The VIIth/VIIIth nerve complex appears normal.  The mastoid air cells appear normal.  The paranasal sinuses appear normal.  Flow voids are identified within the major intracerebral arteries.  After the infusion of contrast material, a normal enhancement pattern is noted.   This MRI of the brain with and without contrast shows the following: 1.  Small T2/FLAIR hyperintense foci in the periventricular, juxtacortical and deep white matter.  These could be consistent with chronic demyelinating plaque associated with multiple sclerosis though are somewhat nonspecific.  None of these appear to be acute.  They do not enhance.  Compared to the MRI from 05/23/2020, there were no new lesions. 2.   Normal enhancement pattern.  No acute findings. INTERPRETING PHYSICIAN: Richard A. Epimenio Foot, MD, PhD, FAAN Certified in  Neuroimaging by [employer]     ROS 14 pt review of systems performed and negative (unless mentioned in an HPI)  Objective: BP 111/72   Pulse 90   Temp 98.2 F (36.8 C) (Oral)   Ht 5\' 4"  (1.626 m)   Wt 154 lb (69.9 kg)   LMP 10/16/2021   SpO2 100%   BMI 26.43 kg/m  Physical Exam Vitals and nursing note reviewed.  Constitutional:      General: She is not in acute distress.    Appearance: Normal appearance. She is not ill-appearing or toxic-appearing.  HENT:     Head: Normocephalic and atraumatic.     Right Ear: Tympanic membrane, ear canal and external ear normal. There is no impacted cerumen.     Left Ear: Tympanic membrane, ear canal and external ear normal. There is no impacted cerumen.     Nose: No congestion or rhinorrhea.     Mouth/Throat:     Mouth: Mucous membranes are moist.  Pharynx: Oropharynx is clear. No oropharyngeal exudate or posterior oropharyngeal erythema.  Eyes:     General: No scleral icterus.       Right eye: No discharge.        Left eye: No discharge.     Extraocular Movements: Extraocular  movements intact.     Conjunctiva/sclera: Conjunctivae normal.     Pupils: Pupils are equal, round, and reactive to light.  Cardiovascular:     Rate and Rhythm: Normal rate and regular rhythm.     Pulses: Normal pulses.     Heart sounds: Normal heart sounds. No murmur heard.    No friction rub. No gallop.  Pulmonary:     Effort: Pulmonary effort is normal. No respiratory distress.     Breath sounds: Normal breath sounds. No stridor. No wheezing, rhonchi or rales.  Chest:     Chest wall: No tenderness.  Abdominal:     General: Abdomen is flat. Bowel sounds are normal. There is no distension.     Palpations: Abdomen is soft. There is no mass.     Tenderness: There is no abdominal tenderness. There is no right CVA tenderness, left CVA tenderness, guarding or rebound.     Hernia: No hernia is present.  Musculoskeletal:        General: No swelling, tenderness or deformity. Normal range of motion.     Cervical back: Normal range of motion and neck supple. No rigidity or tenderness.     Right lower leg: No edema.     Left lower leg: No edema.  Lymphadenopathy:     Cervical: No cervical adenopathy.  Skin:    General: Skin is warm and dry.     Coloration: Skin is not jaundiced or pale.     Findings: No bruising, erythema, lesion or rash.  Neurological:     General: No focal deficit present.     Mental Status: She is alert and oriented to person, place, and time. Mental status is at baseline.     Cranial Nerves: No cranial nerve deficit.     Sensory: No sensory deficit.     Motor: No weakness.     Coordination: Coordination normal.     Gait: Gait normal.     Deep Tendon Reflexes: Reflexes normal.  Psychiatric:        Mood and Affect: Mood normal.        Behavior: Behavior normal.        Thought Content: Thought content normal.        Judgment: Judgment normal.      No results found.  Assessment/plan: Taylor Clements is a 51 y.o. female present for CPE  Multiple sclerosis  (HCC) Managed by neuro Hypertriglyceridemia - Comprehensive metabolic panel - Lipid panel - TSH High risk medication use - CBC - Hemoglobin A1c Breast cancer screening by mammogram - MM 3D SCREEN BREAST BILATERAL; Future Need for immunization against influenza Declined Need for vaccination for zoster declined Routine general medical examination at a health care facility Colonoscopy: No fhx; COMPLETED 09/2020 Mammogram: completed: FHX mother, completed 06/2020, birads 1. @ [place]- [facility]> order placed Cervical cancer screening: last pap: 03/23/2021- Dr. Erling Cruz Immunizations: tdap 2015, Influenza declined (encouraged yearly). Shingrix declined Infectious disease screening: HIV completed, Hep C completed  DEXA: routine screen, no chronic steroid with MS  Patient was encouraged to exercise greater than 150 minutes a week. Patient was encouraged to choose a diet filled with fresh fruits and vegetables, and lean meats. AVS provided  to patient today for education/recommendation on gender specific health and safety maintenance.  Return in about 1 year (around 10/24/2022) for cpe (20 min).  Orders Placed This Encounter  Procedures   MM 3D SCREEN BREAST BILATERAL   CBC   Comprehensive metabolic panel   Hemoglobin A1c   Lipid panel   TSH   Meds ordered this encounter  Medications   iron polysaccharides (NIFEREX) 150 MG capsule    Sig: Take 1 capsule (150 mg total) by mouth daily.    Dispense:  90 capsule    Refill:  3   Referral Orders  No referral(s) requested today     Electronically signed by: Howard Pouch, [place]

## 2021-10-23 LAB — COMPREHENSIVE METABOLIC PANEL
AG Ratio: 1.2 (calc) (ref 1.0–2.5)
ALT: 13 U/L (ref 6–29)
AST: 15 U/L (ref 10–35)
Albumin: 4.1 g/dL (ref 3.6–5.1)
Alkaline phosphatase (APISO): 53 U/L (ref 37–153)
BUN: 13 mg/dL (ref 7–25)
CO2: 23 mmol/L (ref 20–32)
Calcium: 9.2 mg/dL (ref 8.6–10.4)
Chloride: 105 mmol/L (ref 98–110)
Creat: 0.78 mg/dL (ref 0.50–1.03)
Globulin: 3.4 g/dL (calc) (ref 1.9–3.7)
Glucose, Bld: 89 mg/dL (ref 65–99)
Potassium: 4.4 mmol/L (ref 3.5–5.3)
Sodium: 139 mmol/L (ref 135–146)
Total Bilirubin: 0.3 mg/dL (ref 0.2–1.2)
Total Protein: 7.5 g/dL (ref 6.1–8.1)

## 2021-10-23 LAB — CBC
HCT: 38 % (ref 35.0–45.0)
Hemoglobin: 13.1 g/dL (ref 11.7–15.5)
MCH: 31.4 pg (ref 27.0–33.0)
MCHC: 34.5 g/dL (ref 32.0–36.0)
MCV: 91.1 fL (ref 80.0–100.0)
MPV: 8.5 fL (ref 7.5–12.5)
Platelets: 268 10*3/uL (ref 140–400)
RBC: 4.17 10*6/uL (ref 3.80–5.10)
RDW: 12.6 % (ref 11.0–15.0)
WBC: 5.4 10*3/uL (ref 3.8–10.8)

## 2021-10-23 LAB — LIPID PANEL
Cholesterol: 161 mg/dL (ref <200)
HDL: 75 mg/dL (ref >=50)
LDL Cholesterol (Calc): 65 mg/dL (calc)
Non-HDL Cholesterol (Calc): 86 mg/dL (calc) (ref <130)
Total CHOL/HDL Ratio: 2.1 (calc) (ref <5.0)
Triglycerides: 128 mg/dL (ref <150)

## 2021-10-23 LAB — TSH: TSH: 1.12 mIU/L

## 2021-10-23 LAB — HEMOGLOBIN A1C
Hgb A1c MFr Bld: 5.1 % of total Hgb (ref <5.7)
Mean Plasma Glucose: 100 mg/dL
eAG (mmol/L): 5.5 mmol/L

## 2021-11-23 ENCOUNTER — Encounter: Payer: No Typology Code available for payment source | Admitting: Family Medicine

## 2021-12-30 ENCOUNTER — Encounter: Payer: No Typology Code available for payment source | Admitting: Family Medicine

## 2022-01-18 ENCOUNTER — Other Ambulatory Visit (HOSPITAL_COMMUNITY): Payer: Self-pay

## 2022-01-18 MED ORDER — DROSPIRENONE-ETHINYL ESTRADIOL 3-0.02 MG PO TABS
1.0000 | ORAL_TABLET | Freq: Every day | ORAL | 4 refills | Status: DC
  Filled 2022-01-18 – 2022-01-19 (×2): qty 84, 84d supply, fill #0
  Filled 2022-04-09: qty 84, 84d supply, fill #1
  Filled 2022-07-11: qty 84, 84d supply, fill #2

## 2022-01-19 ENCOUNTER — Other Ambulatory Visit: Payer: Self-pay

## 2022-01-19 ENCOUNTER — Other Ambulatory Visit (HOSPITAL_COMMUNITY): Payer: Self-pay

## 2022-01-20 ENCOUNTER — Other Ambulatory Visit: Payer: Self-pay

## 2022-02-01 ENCOUNTER — Other Ambulatory Visit (HOSPITAL_COMMUNITY): Payer: Self-pay

## 2022-03-30 DIAGNOSIS — L738 Other specified follicular disorders: Secondary | ICD-10-CM | POA: Diagnosis not present

## 2022-03-30 DIAGNOSIS — Z129 Encounter for screening for malignant neoplasm, site unspecified: Secondary | ICD-10-CM | POA: Diagnosis not present

## 2022-03-30 DIAGNOSIS — Z85828 Personal history of other malignant neoplasm of skin: Secondary | ICD-10-CM | POA: Diagnosis not present

## 2022-03-30 DIAGNOSIS — L821 Other seborrheic keratosis: Secondary | ICD-10-CM | POA: Diagnosis not present

## 2022-04-09 ENCOUNTER — Other Ambulatory Visit (HOSPITAL_COMMUNITY): Payer: Self-pay

## 2022-04-09 MED ORDER — TRETINOIN 0.025 % EX CREA
1.0000 | TOPICAL_CREAM | Freq: Every day | CUTANEOUS | 6 refills | Status: AC
  Filled 2022-04-09: qty 45, 30d supply, fill #0
  Filled 2023-03-17: qty 45, 30d supply, fill #1

## 2022-04-17 ENCOUNTER — Other Ambulatory Visit (HOSPITAL_BASED_OUTPATIENT_CLINIC_OR_DEPARTMENT_OTHER): Payer: Self-pay

## 2022-04-17 ENCOUNTER — Ambulatory Visit (HOSPITAL_BASED_OUTPATIENT_CLINIC_OR_DEPARTMENT_OTHER)
Admission: RE | Admit: 2022-04-17 | Discharge: 2022-04-17 | Disposition: A | Discharge status: Home / Self Care | Payer: Commercial Managed Care - PPO | Source: Ambulatory Visit | Attending: Family Medicine | Admitting: Family Medicine

## 2022-04-17 DIAGNOSIS — Z1231 Encounter for screening mammogram for malignant neoplasm of breast: Secondary | ICD-10-CM

## 2022-04-20 ENCOUNTER — Other Ambulatory Visit: Payer: Self-pay | Admitting: Family Medicine

## 2022-04-20 DIAGNOSIS — R928 Other abnormal and inconclusive findings on diagnostic imaging of breast: Secondary | ICD-10-CM

## 2022-04-28 ENCOUNTER — Ambulatory Visit
Admission: RE | Admit: 2022-04-28 | Discharge: 2022-04-28 | Disposition: A | Discharge status: Home / Self Care | Payer: Commercial Managed Care - PPO | Source: Ambulatory Visit | Attending: Family Medicine | Admitting: Family Medicine

## 2022-04-28 DIAGNOSIS — R928 Other abnormal and inconclusive findings on diagnostic imaging of breast: Secondary | ICD-10-CM

## 2022-04-28 DIAGNOSIS — N6012 Diffuse cystic mastopathy of left breast: Secondary | ICD-10-CM | POA: Diagnosis not present

## 2022-06-13 ENCOUNTER — Ambulatory Visit: Payer: Commercial Managed Care - PPO | Admitting: Neurology

## 2022-06-13 ENCOUNTER — Encounter: Payer: Self-pay | Admitting: Neurology

## 2022-06-13 ENCOUNTER — Telehealth: Payer: Self-pay | Admitting: Neurology

## 2022-06-13 VITALS — BP 110/75 | HR 74 | Ht 64.5 in | Wt 153.0 lb

## 2022-06-13 DIAGNOSIS — G35 Multiple sclerosis: Secondary | ICD-10-CM

## 2022-06-13 NOTE — Telephone Encounter (Signed)
[facility] [employer] NPR sent to GI [phone] 

## 2022-06-13 NOTE — Progress Notes (Signed)
[facility]  PATIENT: Taylor Clements DOB: [birth year]  REFERRING DOCTOR OR PCP:  Felix Pacini   _________________________________   HISTORICAL  CHIEF COMPLAINT:  She is a 52 y.o. woman with relapsing remitting multiple sclerosis diagnosed in 2005  Update 06/13/2022: She feels her MS is stable.   She did her second year of Mavenclad soon May 15-19 and June 12-16, 2022.  (She had her first year of Mavenclad in May 18-22 and Jul 08, 2019).   She generally tolerated the weeks of therapy well with mild fatigue and headaches a few days after each cycle.  She is completely at baseline now.  She has noted no new symptoms  Before she had the second year of therapy, she had an MRI of the brain 05/23/2020.  It showed no new lesions compared to 04/17/2019.  She denies significant difficulty with gait, balance, strength and sensation.  She is able to walk > 3 miles without stopping.   She sometimes feels she needs to hold the bannister going downstairs but not upstairs.  She denies weakness or spasticity Bladder function is unchanged with occasional urgency.   Vision is fine (needs readers and gets some eye strain though).     Notes fatigue some afternoons/evenings.   She sleeps well most nights.   Mood is good.   She denies cognitive issues.  She has mild anemia and her PCP checked labs showing very low ferritin of 3 ng/mL (16-232).  She had normal colonoscopy and UGD.  She also had an endometrial biopsy which was fine.   She is on Lysteda during periods to reduce flow.    She takes iron pills.        She had a basal cell carcinoma in December 2022.  She had a clear Moh's margin on the second cut [facility].  Dr. Earnest Conroy).  She has not had any further lesions    MS History:  She was diagnosed with MS in late 2005.  She first presented with right optic neuritis that year.   She was treated with several days of IV Solu-Medrol and vision improved.   An  MRI was consistent with MS.   She was initially placed on Avonex late 2005 or early 2006.  Around 2014, after Plegridy was approved, she switched from Avonex to Plegridy.   She has no definite exacerbation.  Around 2013, she noted spasticity in her right arm and more fatigue.     She was placed on baclofen but only took it about a month or less.     She has not been on any treatment for her fatigue.   She had her 1st year Mavenclad May 18/Jul 08 2019.  She had the second year Mavenclad May/June 2022.    Imaging studies: MRI of the brain performed in 2018.  It shows 6 or 7 small T2/flair hyperintense foci, 2 in the periventricular white matter and the rest in the subcortical and deep white matter.  None of the foci appear to be acute.  There was no atrophy.  There are no lesions in the infratentorial white matter.  None of the foci enhance.  MRI 04/17/19 showed scattered T2/FLAIR hyperintense foci in the periventricular, juxtacortical and deep white matter of the hemispheres in a pattern and configuration compatible with chronic demyelinating plaque associated with multiple sclerosis.  None the foci appear to be acute.  They do not enhance.  Compared to the MRI dated 07/10/2017, there are no  new lesions.  MRI 05/23/2020 shwoed T2/flair hyperintense foci in the hemispheres.  None of the foci enhance or appear to be acute.  They are consistent with chronic demyelinating plaque associated with multiple sclerosis.  Compared to the MRI from 04/17/2019, there are no new lesions.  She has no FH of MS or other autoimmune disorder.        REVIEW OF SYSTEMS: Constitutional: No fevers, chills, sweats, or change in appetite.  She has fatigue.    Sleeps well Eyes: No visual changes, double vision, eye pain Ear, nose and throat: No hearing loss, ear pain, nasal congestion, sore throat Cardiovascular: No chest pain, palpitations Respiratory:  No shortness of breath at rest or with exertion.   No  wheezes GastrointestinaI: No nausea, vomiting, abdominal pain, fecal incontinence.  Occ diarrhea, maybe IBS.  Genitourinary:  No dysuria,.  No nocturia.  Some urgency Musculoskeletal:  No neck pain, back pain Integumentary: No rash, pruritus, skin lesions Neurological: as above Psychiatric: No depression at this time.  No anxiety Endocrine: No palpitations, diaphoresis, change in appetite, change in weigh or increased thirst Hematologic/Lymphatic:  No anemia, purpura, petechiae. Allergic/Immunologic: No itchy/runny eyes, nasal congestion, recent allergic reactions, rashes  ALLERGIES: No Known Allergies  HOME MEDICATIONS:  Current Outpatient Medications:    Ascorbic Acid (VITAMIN C) 500 MG CAPS, Take 1 tablet by mouth daily., Disp: , Rfl:    Calcium 500-125 MG-UNIT TABS, Take 2 tablets by mouth daily., Disp: , Rfl:    Cladribine, 7 Tabs, (MAVENCLAD, 7 TABS,) 10 MG TBPK, Take by mouth. Year 1 Month 1 and 2: Days 1-2: 2 tabs per day and Days 3-5: 1 tab per day to equal 7 tabs total, Disp: , Rfl:    hyoscyamine (LEVSIN SL) 0.125 MG SL tablet, Place 1 tablet (0.125 mg total) under the tongue every 4 (four) hours as needed., Disp: 90 tablet, Rfl: 1   loratadine (CLARITIN) 10 MG tablet, Take 10 mg by mouth daily., Disp: , Rfl:    LORazepam (ATIVAN) 1 MG tablet, Take 1 tablet (1 mg total) by mouth every 12 (twelve) hours as needed for anxiety., Disp: 10 tablet, Rfl: 0   metroNIDAZOLE (METROCREAM) 0.75 % cream, Apply 1 application topically 2 (two) times daily., Disp: , Rfl:    Multiple Vitamin (MULTIVITAMIN) capsule, Take 1 capsule by mouth daily., Disp: , Rfl:    Olopatadine HCl 0.2 % SOLN, Apply 1 drop to eye daily., Disp: , Rfl:    Omega-3 Fatty Acids (FISH OIL) 1200 MG CAPS, Take 1 capsule by mouth 2 (two) times daily., Disp: , Rfl:    Polysaccharide-Iron Complex 150 MG CAPS, Take 1 capsule by mouth daily., Disp: 90 capsule, Rfl: 3   triamcinolone (NASACORT ALLERGY 24HR) 55 MCG/ACT AERO nasal  inhaler, Place 2 sprays into the nose daily., Disp: , Rfl:   Current Facility-Administered Medications:    0.9 %  sodium chloride infusion, 500 mL, Intravenous, Once, Jenel Lucks, MD  PAST MEDICAL HISTORY: Past Medical History:  Diagnosis Date   Actinic keratitis 12/07/2016   shaved, deep margins- pt declined further biospy, aldara and  Excel V laser completed by derm   Allergy    Anemia    Chicken pox    Idiopathic chronic venous hypertension of both legs with inflammation    Menorrhagia    control on BCP   Multiple sclerosis (HCC) 2005   diagnosed 2005; after optic Neuritis. Has IBS, fatiue and right arm paresthesia w/ flares   Optic neuritis  due to multiple sclerosis (HCC) 2007   loss vision; IV steroids --> vision returned.    Phobia, flying    uses ativan with flights   Post partum depression    Venous insufficiency (chronic) (peripheral)     PAST SURGICAL HISTORY: Past Surgical History:  Procedure Laterality Date   DILATION AND EVACUATION  2003   nonviable pregnancy   image: duplex LLE Left 05/16/2016   scanned into media.    Image: MRI Brain  07/10/2017   Stable minor subcortical & periventricular white matter changes; MS   LASER ABLATION Left 2014   left greater saphenous vein, then endoevous chemical ablation and sclerotherpay.    procedure: Excel V laser  12/07/2016   Ak tip of nose shaved-deep margins-pt declined deeper bx, aldara cream and laser completed by derm.     FAMILY HISTORY: Family History  Problem Relation Age of Onset   Breast cancer Mother    Asthma Brother    Depression Brother    Drug abuse Brother    Learning disabilities Brother    Stroke Maternal Grandmother    Colon cancer Neg Hx    Esophageal cancer Neg Hx    Pancreatic cancer Neg Hx    Stomach cancer Neg Hx    Rectal cancer Neg Hx     SOCIAL HISTORY:  Social History   Socioeconomic History   Marital status: Married    Spouse name: Aurther Loft   Number of children: 1    Years of education: BA   Highest education level: Not on file  Occupational History   Occupation: n/a  Tobacco Use   Smoking status: Never   Smokeless tobacco: Never  Vaping Use   Vaping Use: Never used  Substance and Sexual Activity   Alcohol use: Not Currently    Comment: rare   Drug use: Never   Sexual activity: Yes    Partners: Male    Birth control/protection: Condom  Other Topics Concern   Not on file  Social History Narrative   Marital status/children/pets: married, 1 child   Safety:      -smoke alarm in the home:Yes     - wears seatbelt: Yes     - Feels safe in their relationships: Yes      Right handed    Caffeine use: tea daily   Social Determinants of Health   Financial Resource Strain: Not on file  Food Insecurity: Not on file  Transportation Needs: Not on file  Physical Activity: Not on file  Stress: Not on file  Social Connections: Not on file  Intimate Partner Violence: Not on file     PHYSICAL EXAM (from 03/27/2018 initial consult)  Vitals:   06/09/21 1554  BP: 127/72  Pulse: 86  Weight: 153 lb (69.4 kg)  Height: 5' 4.5" (1.638 m)    Body mass index is 25.86 kg/m.  VA:   OD 20/25    OS 20/20  General: The patient is well-developed and well-nourished and in no acute distress  Skin: Extremities are without significant edema.  Neurologic Exam  Mental status: The patient is alert and oriented x 3 at the time of the examination. The patient has apparent normal recent and remote memory, with an apparently normal attention span and concentration ability.   Speech is normal.  Cranial nerves: Extraocular movements are full.. There is good facial sensation to soft touch bilaterally.Facial strength is normal.  Trapezius and sternocleidomastoid strength is normal. No dysarthria is noted.  Hearing seems normal  and symmetric  motor:  Muscle bulk is normal.   Tone is normal. Strength is  5 / 5 in all 4 extremities.   Sensory: Sensory testing is intact  to pinprick, soft touch and vibration sensation in all 4 extremities.  Coordination: Cerebellar testing reveals good finger-nose-finger and heel-to-shin bilaterally.  Gait and station: Station is normal.     The gait was normal.  Tandem gait is mildly wide..   Romberg is negative.   Reflexes: Deep tendon reflexes are symmetric and normal bilaterally.      Multiple sclerosis (HCC) - Plan: MR BRAIN W WO CONTRAST, CBC with Differential/Platelet, Hepatic function panel  High risk medication use - Plan: CBC with Differential/Platelet, Hepatic function panel  History of optic neuritis   1.    Her MS appears to be stable with no exacerbations since starting Mavenclad.  Hopefully, this will continue for many years.  We will check the MRI of the brain to determine if there is any subclinical progression.  If this is occurring, we will need to consider restarting a disease modifying therapy.  2.   Stay active and exercise as tolerated. 3.   She will return to see me in 6 months, depending on which treatment she begins.  Research data: Today, she scored EDSS = 1.0 due to 1 on visual subscales all others are 0.  Amb Index = 0   Jaella Weinert A. Epimenio Foot, MD, [facility] 06/09/2021, 6:45 PM Certified in Neurology, Clinical Neurophysiology, Sleep Medicine, Pain Medicine and Neuroimaging  [facility] [address] [place], [place] [address] [phone]

## 2022-06-14 ENCOUNTER — Ambulatory Visit: Payer: No Typology Code available for payment source | Admitting: Neurology

## 2022-06-27 ENCOUNTER — Telehealth (INDEPENDENT_AMBULATORY_CARE_PROVIDER_SITE_OTHER): Payer: Commercial Managed Care - PPO | Admitting: Family Medicine

## 2022-06-27 ENCOUNTER — Other Ambulatory Visit: Payer: Self-pay

## 2022-06-27 ENCOUNTER — Encounter: Payer: Self-pay | Admitting: Family Medicine

## 2022-06-27 DIAGNOSIS — F40243 Fear of flying: Secondary | ICD-10-CM | POA: Diagnosis not present

## 2022-06-27 DIAGNOSIS — T753XXA Motion sickness, initial encounter: Secondary | ICD-10-CM

## 2022-06-27 MED ORDER — SCOPOLAMINE 1 MG/3DAYS TD PT72
1.0000 | MEDICATED_PATCH | TRANSDERMAL | 0 refills | Status: DC
  Filled 2022-06-27: qty 4, 12d supply, fill #0

## 2022-06-27 MED ORDER — LORAZEPAM 1 MG PO TABS
1.0000 mg | ORAL_TABLET | Freq: Two times a day (BID) | ORAL | 0 refills | Status: DC | PRN
  Filled 2022-06-27: qty 10, 5d supply, fill #0

## 2022-06-27 NOTE — Patient Instructions (Signed)
No follow-ups on file.        Great to see you today.  I have refilled the medication(s) we provide.   If labs were collected, we will inform you of lab results once received either by echart message or telephone call.   - echart message- for normal results that have been seen by the patient already.   - telephone call: abnormal results or if patient has not viewed results in their echart.  

## 2022-06-27 NOTE — Progress Notes (Signed)
VIRTUAL VISIT VIA VIDEO  I connected with Taylor Clements on 06/27/22 at  1:00 PM EDT by a video enabled telemedicine application and verified that I am speaking with the correct person using two identifiers. Location patient: Home Location provider: [facility], Office Persons participating in the virtual visit: Patient, Dr. Claiborne Billings and Ivonne Andrew, CMA  I discussed the limitations of evaluation and management by telemedicine and the availability of in person appointments. The patient expressed understanding and agreed to proceed.    Taylor Clements , 1970/03/09, 52 y.o., female MRN: [id number] Patient Care Team    Relationship Specialty Notifications Start End  Natalia Leatherwood, DO PCP - General Family Medicine  11/21/17   Lavonna Monarch, FNP  Nurse Practitioner  10/22/21     Chief Complaint  Patient presents with   Travel medication    Cruise and flying: next month     Subjective: Taylor Clements is a 52 y.o. Pt presents for an virtual-OV to discuss her flying phobia and seasickness.  She has 2 upcoming trips.  Her first trip is to [place] in a couple weeks in which she is flying.  Her second trip is in September, where she will fly to [place] and then board a cruise ship.  She has used Ativan in the past to help with the flying phobia.  This has worked well for her in the past. She used Dramamine in the past for her seasickness, that did not work well for her.    06/27/2022    1:04 PM 10/22/2021    3:43 PM 04/28/2020    7:59 AM 03/12/2019    9:26 AM 12/27/2017    9:11 AM  Depression screen PHQ 2/9  Decreased Interest 0 0 0 0 0  Down, Depressed, Hopeless 0 0 0 0 0  PHQ - 2 Score 0 0 0 0 0  Altered sleeping     0  Tired, decreased energy     1  Change in appetite     0  Feeling bad or failure about yourself      0  Trouble concentrating     0  Moving slowly or fidgety/restless     0  Suicidal thoughts     0  PHQ-9 Score     1  Difficult  doing work/chores     Not difficult at all    No Known Allergies Social History   Social History Narrative   Marital status/children/pets: married, 1 child   Safety:      -smoke alarm in the home:Yes     - wears seatbelt: Yes     - Feels safe in their relationships: Yes      Right handed    Caffeine use: tea daily   Past Medical History:  Diagnosis Date   Actinic keratitis 12/07/2016   shaved, deep margins- pt declined further biospy, aldara and  Excel V laser completed by derm   Allergy    Anemia    Chicken pox    Idiopathic chronic venous hypertension of both legs with inflammation    Menorrhagia    control on BCP   Multiple sclerosis (HCC) 2005   diagnosed 2005; after optic Neuritis. Has IBS, fatiue and right arm paresthesia w/ flares   Optic neuritis due to multiple sclerosis (HCC) 2007   loss vision; IV steroids --> vision returned.    Phobia, flying    uses ativan with  flights   Post partum depression    Venous insufficiency (chronic) (peripheral)    Past Surgical History:  Procedure Laterality Date   DILATION AND EVACUATION  2003   nonviable pregnancy   image: duplex LLE Left 05/16/2016   scanned into media.    Image: MRI Brain  07/10/2017   Stable minor subcortical & periventricular white matter changes; MS   LASER ABLATION Left 2014   left greater saphenous vein, then endoevous chemical ablation and sclerotherpay.    MOHS SURGERY  12/2020   procedure: Excel V laser  12/07/2016   Ak tip of nose shaved-deep margins-pt declined deeper bx, aldara cream and laser completed by derm.    Family History  Problem Relation Age of Onset   Breast cancer Mother    Asthma Brother    Depression Brother    Drug abuse Brother    Learning disabilities Brother    Stroke Maternal Grandmother    Colon cancer Neg Hx    Esophageal cancer Neg Hx    Pancreatic cancer Neg Hx    Stomach cancer Neg Hx    Rectal cancer Neg Hx    Allergies as of 06/27/2022   No Known  Allergies      Medication List        Accurate as of June 27, 2022  1:56 PM. If you have any questions, ask your nurse or doctor.          Calcium 500-125 MG-UNIT Tabs Take 2 tablets by mouth daily.   Ferrex 150 150 MG capsule Generic drug: iron polysaccharides Take 1 capsule (150 mg total) by mouth daily.   Fish Oil 1200 MG Caps Take 1 capsule by mouth 2 (two) times daily.   hyoscyamine 0.125 MG SL tablet Commonly known as: LEVSIN SL Place 1 tablet (0.125 mg total) under the tongue every 4 (four) hours as needed.   Jasmiel 3-0.02 MG tablet Generic drug: drospirenone-ethinyl estradiol Take 1 tablet by mouth daily.   loratadine 10 MG tablet Commonly known as: CLARITIN Take 10 mg by mouth daily.   LORazepam 1 MG tablet Commonly known as: ATIVAN Take 1 tablet (1 mg total) by mouth every 12 (twelve) hours as needed for anxiety. Started by: Felix Pacini, DO   Mavenclad (7 Tabs) 10 MG Tbpk Generic drug: Cladribine (7 Tabs) Take by mouth. Year 1 Month 1 and 2: Days 1-2: 2 tabs per day and Days 3-5: 1 tab per day to equal 7 tabs total   multivitamin capsule Take 1 capsule by mouth daily.   Olopatadine HCl 0.2 % Soln Apply 1 drop to eye daily.   scopolamine 1 MG/3DAYS Commonly known as: TRANSDERM-SCOP Place 1 patch (1.5 mg total) onto the skin every 3 (three) days. Started by: Felix Pacini, DO   tretinoin 0.025 % cream Commonly known as: RETIN-A Apply 1 Application topically at bedtime.   Vitamin C 500 MG Caps Take 1 tablet by mouth daily.        All past medical history, surgical history, allergies, family history, immunizations andmedications were updated in the EMR today and reviewed under the history and medication portions of their EMR.     ROS Negative, with the exception of above mentioned in HPI   Objective:  LMP 06/25/2022  There is no height or weight on file to calculate BMI.  Physical Exam Vitals and nursing note reviewed.   Constitutional:      General: She is not in acute distress.    Appearance: Normal  appearance. She is not toxic-appearing.  HENT:     Head: Normocephalic and atraumatic.  Eyes:     General: No scleral icterus.       Right eye: No discharge.        Left eye: No discharge.     Conjunctiva/sclera: Conjunctivae normal.  Pulmonary:     Effort: Pulmonary effort is normal.  Musculoskeletal:     Cervical back: Normal range of motion.  Skin:    Findings: No rash.  Neurological:     Mental Status: She is alert and oriented to person, place, and time. Mental status is at baseline.  Psychiatric:        Mood and Affect: Mood normal.        Behavior: Behavior normal.        Thought Content: Thought content normal.        Judgment: Judgment normal.      No results found. No results found. No results found for this or any previous visit (from the past 24 hour(s)).  Assessment/Plan: Lira Franz is a 52 y.o. female present for OV for  Seasickness, initial encounter Discussed different options.  Dramamine has been used in the past and was not helpful. Discussed scopolamine patches and appropriate use.  Patient understands she is to place this patch on the area behind her ear, at least 4 hours before boarding ship or the day prior.  Patch is to be changed every 72 hours.  Flying phobia Ativan 0.5-1 mg twice daily as needed before flight. [place] controlled substance database reviewed today and appropriate  Reviewed expectations re: course of current medical issues. Discussed self-management of symptoms. Outlined signs and symptoms indicating need for more acute intervention. Patient verbalized understanding and all questions were answered. Patient received an After-Visit Summary.    No orders of the defined types were placed in this encounter.  Meds ordered this encounter  Medications   LORazepam (ATIVAN) 1 MG tablet    Sig: Take 1 tablet (1 mg total) by mouth every  12 (twelve) hours as needed for anxiety.    Dispense:  10 tablet    Refill:  0   scopolamine (TRANSDERM-SCOP) 1 MG/3DAYS    Sig: Place 1 patch (1.5 mg total) onto the skin every 3 (three) days.    Dispense:  4 patch    Refill:  0   Referral Orders  No referral(s) requested today     Note is dictated utilizing voice recognition software. Although note has been proof read prior to signing, occasional typographical errors still can be missed. If any questions arise, please do not hesitate to call for verification.   electronically signed by:  Felix Pacini, DO  [facility] [place]

## 2022-06-28 ENCOUNTER — Other Ambulatory Visit (HOSPITAL_COMMUNITY): Payer: Self-pay

## 2022-07-11 ENCOUNTER — Other Ambulatory Visit (HOSPITAL_COMMUNITY): Payer: Self-pay

## 2022-07-11 ENCOUNTER — Other Ambulatory Visit: Payer: Self-pay

## 2022-07-11 MED ORDER — FLUOROURACIL 5 % EX CREA
TOPICAL_CREAM | CUTANEOUS | 0 refills | Status: DC
  Filled 2022-07-11 (×2): qty 40, 14d supply, fill #0

## 2022-08-02 ENCOUNTER — Encounter: Payer: Self-pay | Admitting: Family Medicine

## 2022-08-02 NOTE — Telephone Encounter (Signed)
It would be difficult to give patient accurate medical advice surrounding an elevated heart rate without seeing her.  Walking can elevate heart rate.  Dehydration can elevate heart rate.  Decongestants can elevate heart rate. Would encourage her to stay hydrated and not use the decongestant, follow-up when she gets home if still having symptoms.

## 2022-08-02 NOTE — Telephone Encounter (Signed)
No further action needed.

## 2022-08-06 ENCOUNTER — Ambulatory Visit
Admission: RE | Admit: 2022-08-06 | Discharge: 2022-08-06 | Disposition: A | Discharge status: Home / Self Care | Payer: Commercial Managed Care - PPO | Source: Ambulatory Visit | Attending: Neurology | Admitting: Neurology

## 2022-08-06 DIAGNOSIS — G35 Multiple sclerosis: Secondary | ICD-10-CM

## 2022-08-06 MED ORDER — GADOPICLENOL 0.5 MMOL/ML IV SOLN
8.0000 mL | Freq: Once | INTRAVENOUS | Status: AC | PRN
  Administered 2022-08-06: 8 mL via INTRAVENOUS

## 2022-08-09 ENCOUNTER — Encounter: Payer: Self-pay | Admitting: Neurology

## 2022-09-11 ENCOUNTER — Other Ambulatory Visit (HOSPITAL_COMMUNITY): Payer: Self-pay

## 2022-09-12 ENCOUNTER — Other Ambulatory Visit (HOSPITAL_COMMUNITY): Payer: Self-pay

## 2022-09-12 MED ORDER — DROSPIRENONE-ETHINYL ESTRADIOL 3-0.02 MG PO TABS
1.0000 | ORAL_TABLET | Freq: Every day | ORAL | 4 refills | Status: DC
  Filled 2022-09-12: qty 84, 84d supply, fill #0
  Filled 2022-12-14: qty 84, 84d supply, fill #1
  Filled 2023-03-17: qty 84, 84d supply, fill #2
  Filled 2023-06-12 (×2): qty 84, 84d supply, fill #3
  Filled 2023-09-04: qty 84, 84d supply, fill #4

## 2022-09-16 ENCOUNTER — Other Ambulatory Visit (HOSPITAL_COMMUNITY): Payer: Self-pay

## 2022-09-16 ENCOUNTER — Other Ambulatory Visit: Payer: Self-pay | Admitting: Family Medicine

## 2022-10-09 ENCOUNTER — Other Ambulatory Visit: Payer: Self-pay | Admitting: Family Medicine

## 2022-10-10 ENCOUNTER — Other Ambulatory Visit: Payer: Self-pay | Admitting: Family Medicine

## 2022-10-12 ENCOUNTER — Encounter (HOSPITAL_COMMUNITY): Payer: Self-pay

## 2022-10-12 ENCOUNTER — Other Ambulatory Visit (HOSPITAL_COMMUNITY): Payer: Self-pay

## 2022-10-15 ENCOUNTER — Encounter: Payer: Self-pay | Admitting: Family Medicine

## 2022-10-31 DIAGNOSIS — N921 Excessive and frequent menstruation with irregular cycle: Secondary | ICD-10-CM | POA: Diagnosis not present

## 2022-10-31 DIAGNOSIS — N814 Uterovaginal prolapse, unspecified: Secondary | ICD-10-CM | POA: Diagnosis not present

## 2022-10-31 DIAGNOSIS — Z01419 Encounter for gynecological examination (general) (routine) without abnormal findings: Secondary | ICD-10-CM | POA: Diagnosis not present

## 2022-11-16 ENCOUNTER — Other Ambulatory Visit (HOSPITAL_BASED_OUTPATIENT_CLINIC_OR_DEPARTMENT_OTHER): Payer: Self-pay

## 2022-11-16 MED ORDER — INFLUENZA VIRUS VACC SPLIT PF (FLUZONE) 0.5 ML IM SUSY
0.5000 mL | PREFILLED_SYRINGE | Freq: Once | INTRAMUSCULAR | 0 refills | Status: AC
  Filled 2022-11-16: qty 0.5, 1d supply, fill #0

## 2022-12-13 ENCOUNTER — Other Ambulatory Visit (HOSPITAL_COMMUNITY): Payer: Self-pay

## 2022-12-15 ENCOUNTER — Other Ambulatory Visit (HOSPITAL_COMMUNITY): Payer: Self-pay

## 2022-12-19 DIAGNOSIS — L820 Inflamed seborrheic keratosis: Secondary | ICD-10-CM | POA: Diagnosis not present

## 2022-12-19 DIAGNOSIS — D229 Melanocytic nevi, unspecified: Secondary | ICD-10-CM | POA: Diagnosis not present

## 2022-12-19 DIAGNOSIS — L821 Other seborrheic keratosis: Secondary | ICD-10-CM | POA: Diagnosis not present

## 2022-12-19 DIAGNOSIS — L738 Other specified follicular disorders: Secondary | ICD-10-CM | POA: Diagnosis not present

## 2023-02-10 ENCOUNTER — Encounter: Payer: Self-pay | Admitting: Family Medicine

## 2023-02-10 ENCOUNTER — Other Ambulatory Visit (HOSPITAL_COMMUNITY): Payer: Self-pay

## 2023-02-10 ENCOUNTER — Encounter (HOSPITAL_COMMUNITY): Payer: Self-pay

## 2023-02-10 ENCOUNTER — Ambulatory Visit: Payer: Commercial Managed Care - PPO | Admitting: Family Medicine

## 2023-02-10 VITALS — BP 102/72 | HR 92 | Temp 98.1°F | Wt 159.4 lb

## 2023-02-10 DIAGNOSIS — N814 Uterovaginal prolapse, unspecified: Secondary | ICD-10-CM | POA: Insufficient documentation

## 2023-02-10 DIAGNOSIS — J329 Chronic sinusitis, unspecified: Secondary | ICD-10-CM | POA: Diagnosis not present

## 2023-02-10 DIAGNOSIS — R42 Dizziness and giddiness: Secondary | ICD-10-CM | POA: Diagnosis not present

## 2023-02-10 MED ORDER — METHYLPREDNISOLONE ACETATE 80 MG/ML IJ SUSP
80.0000 mg | Freq: Once | INTRAMUSCULAR | Status: AC
  Administered 2023-02-10: 80 mg via INTRAMUSCULAR

## 2023-02-10 MED ORDER — MECLIZINE HCL 25 MG PO TABS
25.0000 mg | ORAL_TABLET | Freq: Three times a day (TID) | ORAL | 0 refills | Status: DC | PRN
  Filled 2023-02-10 (×2): qty 30, 10d supply, fill #0

## 2023-02-10 NOTE — Patient Instructions (Signed)
Benign Positional Vertigo Vertigo is the feeling that you or your surroundings are moving when they are not. Benign positional vertigo is the most common form of vertigo. This is usually a harmless condition (benign). This condition is positional. This means that symptoms are triggered by certain movements and positions. This condition can be dangerous if it occurs while you are doing something that could cause harm to yourself or others. This includes activities such as driving or operating machinery. What are the causes? The inner ear has fluid-filled canals that help your brain sense movement and balance. When the fluid moves, the brain receives messages about your body's position. With benign positional vertigo, calcium crystals in the inner ear break free and disturb the inner ear area. This causes your brain to receive confusing messages about your body's position. What increases the risk? You are more likely to develop this condition if: You are a woman. You are 28 years of age or older. You have recently had a head injury. You have an inner ear disease. What are the signs or symptoms? Symptoms of this condition usually happen when you move your head or your eyes in different directions. Symptoms may start suddenly and usually last for less than a minute. They include: Loss of balance and falling. Feeling like you are spinning or moving. Feeling like your surroundings are spinning or moving. Nausea and vomiting. Blurred vision. Dizziness. Involuntary eye movement (nystagmus). Symptoms can be mild and cause only minor problems, or they can be severe and interfere with daily life. Episodes of benign positional vertigo may return (recur) over time. Symptoms may also improve over time. How is this diagnosed? This condition may be diagnosed based on: Your medical history. A physical exam of the head, neck, and ears. Positional tests to check for or stimulate vertigo. You may be asked to  turn your head and change positions, such as going from sitting to lying down. A health care provider will watch for symptoms of vertigo. You may be referred to a health care provider who specializes in ear, nose, and throat problems (ENT or otolaryngologist) or a provider who specializes in disorders of the nervous system (neurologist). How is this treated?  This condition may be treated in a session in which your health care provider moves your head in specific positions to help the displaced crystals in your inner ear move. Treatment for this condition may take several sessions. Surgery may be needed in severe cases, but this is rare. In some cases, benign positional vertigo may resolve on its own in 2-4 weeks. Follow these instructions at home: Safety Move slowly. Avoid sudden body or head movements or certain positions, as told by your health care provider. Avoid driving or operating machinery until your health care provider says it is safe. Avoid doing any tasks that would be dangerous to you or others if vertigo occurs. If you have trouble walking or keeping your balance, try using a cane for stability. If you feel dizzy or unstable, sit down right away. Return to your normal activities as told by your health care provider. Ask your health care provider what activities are safe for you. General instructions Take over-the-counter and prescription medicines only as told by your health care provider. Drink enough fluid to keep your urine pale yellow. Keep all follow-up visits. This is important. Contact a health care provider if: You have a fever. Your condition gets worse or you develop new symptoms. Your family or friends notice any behavioral changes.  You have nausea or vomiting that gets worse. You have numbness or a prickling and tingling sensation. Get help right away if you: Have difficulty speaking or moving. Are always dizzy or faint. Develop severe headaches. Have weakness in  your legs or arms. Have changes in your hearing or vision. Develop a stiff neck. Develop sensitivity to light. These symptoms may represent a serious problem that is an emergency. Do not wait to see if the symptoms will go away. Get medical help right away. Call your local emergency services (911 in the [place] Do not drive yourself to the hospital. Summary Vertigo is the feeling that you or your surroundings are moving when they are not. Benign positional vertigo is the most common form of vertigo. This condition is caused by calcium crystals in the inner ear that become displaced. This causes a disturbance in an area of the inner ear that helps your brain sense movement and balance. Symptoms include loss of balance and falling, feeling that you or your surroundings are moving, nausea and vomiting, and blurred vision. This condition can be diagnosed based on symptoms, a physical exam, and positional tests. Follow safety instructions as told by your health care provider and keep all follow-up visits. This is important. This information is not intended to replace advice given to you by your health care provider. Make sure you discuss any questions you have with your health care provider. Document Revised: 08/01/2022 Document Reviewed: 08/01/2022 Elsevier Patient Education  2024 [employer].

## 2023-02-10 NOTE — Progress Notes (Signed)
Taylor Clements , August 15, 1970, 53 y.o., female MRN: [id number] Patient Care Team    Relationship Specialty Notifications Start End  Natalia Leatherwood, DO PCP - General Family Medicine  11/21/17   Lavonna Monarch, FNP  Nurse Practitioner  10/22/21     Chief Complaint  Patient presents with   Dizziness    Onset Sunday night; HA on tues and thurs; pt is not fasting     Subjective: Taylor Clements is a 53 y.o. Pt presents for an OV with complaints of dizziness started 5 days ago.  Associated symptoms include headache and dizziness came at the end of cycles. Headache resolved with tylenol on Thursday. If she looks down she weill get dizzy.  Patient has a significant past medical history of multiple sclerosis, hypertriglyceridemia, vertigo, gastritis and anemia. Patient is prescribed birth control pills.  She has been on birth control pills for about a year, and has had negative side effects.  She is uncertain if the headache she has been experiencing over the last month could be related to cycles.  Her cycles are no longer having, therefore do not suspect anemia Hydration is good per pt.  She has not been around any known sick contacts.   MRI brain with and without contrast 08/06/2022: IMPRESSION: This MRI of the brain with and without contrast shows the following; Small number of T2/FLAIR hyperintense foci in the cerebral hemispheres including foci in the periventricular and juxtacortical white matter.  These are consistent with multiple sclerosis though are somewhat nonspecific.  None of the foci appear to be acute.  They do not enhance.  Compared to the MRI from 05/23/2020, there were no new lesions. Chronic maxillary, ethmoid and frontal sinusitis consistent with chronic sinusitis.  Small air-fluid levels are also noted and there could be an element of acute sinusitis.     02/10/2023    8:03 AM 06/27/2022    1:04 PM 10/22/2021    3:43 PM 04/28/2020    7:59 AM 03/12/2019     9:26 AM  Depression screen PHQ 2/9  Decreased Interest 0 0 0 0 0  Down, Depressed, Hopeless 0 0 0 0 0  PHQ - 2 Score 0 0 0 0 0    No Known Allergies Social History   Social History Narrative   Marital status/children/pets: married, 1 child   Safety:      -smoke alarm in the home:Yes     - wears seatbelt: Yes     - Feels safe in their relationships: Yes      Right handed    Caffeine use: tea daily   Past Medical History:  Diagnosis Date   Actinic keratitis 12/07/2016   shaved, deep margins- pt declined further biospy, aldara and  Excel V laser completed by derm   Allergy    Anemia    Chicken pox    Idiopathic chronic venous hypertension of both legs with inflammation    Menorrhagia    control on BCP   Multiple sclerosis (HCC) 2005   diagnosed 2005; after optic Neuritis. Has IBS, fatiue and right arm paresthesia w/ flares   Optic neuritis due to multiple sclerosis (HCC) 2007   loss vision; IV steroids --> vision returned.    Phobia, flying    uses ativan with flights   Post partum depression    Venous insufficiency (chronic) (peripheral)    Past Surgical History:  Procedure Laterality Date   DILATION AND EVACUATION  2003  nonviable pregnancy   image: duplex LLE Left 05/16/2016   scanned into media.    Image: MRI Brain  07/10/2017   Stable minor subcortical & periventricular white matter changes; MS   LASER ABLATION Left 2014   left greater saphenous vein, then endoevous chemical ablation and sclerotherpay.    MOHS SURGERY  12/2020   procedure: Excel V laser  12/07/2016   Ak tip of nose shaved-deep margins-pt declined deeper bx, aldara cream and laser completed by derm.    Family History  Problem Relation Age of Onset   Breast cancer Mother    Asthma Brother    Depression Brother    Drug abuse Brother    Learning disabilities Brother    Stroke Maternal Grandmother    Colon cancer Neg Hx    Esophageal cancer Neg Hx    Pancreatic cancer Neg Hx    Stomach  cancer Neg Hx    Rectal cancer Neg Hx    Allergies as of 02/10/2023   No Known Allergies      Medication List        Accurate as of February 10, 2023  1:46 PM. If you have any questions, ask your nurse or doctor.          STOP taking these medications    fluorouracil 5 % cream Commonly known as: EFUDEX Stopped by: Felix Pacini   Mavenclad (7 Tabs) 10 MG Tbpk Generic drug: Cladribine (7 Tabs) Stopped by: Felix Pacini   scopolamine 1 MG/3DAYS Commonly known as: TRANSDERM-SCOP Stopped by: Felix Pacini       TAKE these medications    Calcium 500-125 MG-UNIT Tabs Take 2 tablets by mouth daily.   Ferrex 150 150 MG capsule Generic drug: iron polysaccharides Take 1 capsule (150 mg total) by mouth daily.   Fish Oil 1200 MG Caps Take 1 capsule by mouth 2 (two) times daily.   hyoscyamine 0.125 MG SL tablet Commonly known as: LEVSIN SL Place 1 tablet (0.125 mg total) under the tongue every 4 (four) hours as needed.   Jasmiel 3-0.02 MG tablet Generic drug: drospirenone-ethinyl estradiol Take 1 tablet by mouth daily.   loratadine 10 MG tablet Commonly known as: CLARITIN Take 10 mg by mouth daily.   LORazepam 1 MG tablet Commonly known as: ATIVAN Take 1 tablet (1 mg total) by mouth every 12 (twelve) hours as needed for anxiety.   meclizine 25 MG tablet Commonly known as: ANTIVERT Take 1 tablet (25 mg total) by mouth 3 (three) times daily as needed for dizziness. Started by: Felix Pacini   multivitamin capsule Take 1 capsule by mouth daily.   Olopatadine HCl 0.2 % Soln Apply 1 drop to eye daily.   tretinoin 0.025 % cream Commonly known as: RETIN-A Apply 1 Application topically at bedtime.   Vitamin C 500 MG Caps Take 1 tablet by mouth daily.        All past medical history, surgical history, allergies, family history, immunizations andmedications were updated in the EMR today and reviewed under the history and medication portions of their EMR.      Review of Systems  Constitutional:  Negative for chills, fever and malaise/fatigue.  HENT:  Positive for ear pain and sinus pain. Negative for congestion and sore throat.   Eyes:  Negative for pain.  Respiratory:  Negative for cough, shortness of breath and wheezing.   Gastrointestinal:  Negative for diarrhea, nausea and vomiting.  Musculoskeletal:  Negative for myalgias.  Neurological:  Positive for dizziness (  Vertigo) and headaches. Negative for weakness.   Negative, with the exception of above mentioned in HPI   Objective:  BP 102/72   Pulse 92   Temp 98.1 F (36.7 C)   Wt 159 lb 6.4 oz (72.3 kg)   SpO2 100%   BMI 26.94 kg/m  Body mass index is 26.94 kg/m. Physical Exam Vitals and nursing note reviewed.  Constitutional:      General: She is not in acute distress.    Appearance: Normal appearance. She is normal weight. She is not ill-appearing or toxic-appearing.  HENT:     Head: Normocephalic and atraumatic.     Right Ear: Tympanic membrane, ear canal and external ear normal. There is no impacted cerumen.     Left Ear: Tympanic membrane, ear canal and external ear normal. There is no impacted cerumen.     Nose: No congestion or rhinorrhea.     Mouth/Throat:     Pharynx: No oropharyngeal exudate or posterior oropharyngeal erythema.  Eyes:     General: No scleral icterus.       Right eye: No discharge.        Left eye: No discharge.     Extraocular Movements: Extraocular movements intact.     Conjunctiva/sclera: Conjunctivae normal.     Pupils: Pupils are equal, round, and reactive to light.  Pulmonary:     Effort: Pulmonary effort is normal.     Breath sounds: Normal breath sounds.  Musculoskeletal:     Cervical back: Neck supple.  Lymphadenopathy:     Cervical: No cervical adenopathy.  Skin:    Findings: No rash.  Neurological:     Mental Status: She is alert and oriented to person, place, and time. Mental status is at baseline.     Motor: No weakness.      Coordination: Coordination normal.     Gait: Gait normal.  Psychiatric:        Mood and Affect: Mood normal.        Behavior: Behavior normal.        Thought Content: Thought content normal.        Judgment: Judgment normal.    No results found. No results found. No results found for this or any previous visit (from the past 24 hours).  Assessment/Plan: Taylor Clements is a 53 y.o. female present for OV for  Sinusitis, unspecified chronicity, unspecified location (Primary)/vertigo MRI: July reported chronic sinusitis symptoms.  Patient had discontinued her antihistamine over the last month.  No associated symptoms are present today and your exam is normal.  Suspect she is having chronic sinusitis symptoms creating the headache intermittently over the frontal sinuses. IM Depo-Medrol 80 mg provided today. Patient was encouraged to restart her Claritin and Flonase nasal spray. Meclizine was prescribed if needed for vertigo. Epley maneuver instructions was provided to her via her AVS. - methylPREDNISolone acetate (DEPO-MEDROL) injection 80 mg Follow-up as needed   Reviewed expectations re: course of current medical issues. Discussed self-management of symptoms. Outlined signs and symptoms indicating need for more acute intervention. Patient verbalized understanding and all questions were answered. Patient received an After-Visit Summary.    No orders of the defined types were placed in this encounter.  Meds ordered this encounter  Medications   meclizine (ANTIVERT) 25 MG tablet    Sig: Take 1 tablet (25 mg total) by mouth 3 (three) times daily as needed for dizziness.    Dispense:  30 tablet    Refill:  0  methylPREDNISolone acetate (DEPO-MEDROL) injection 80 mg   Referral Orders  No referral(s) requested today     Note is dictated utilizing voice recognition software. Although note has been proof read prior to signing, occasional typographical errors still can be  missed. If any questions arise, please do not hesitate to call for verification.   electronically signed by:  Felix Pacini, DO  [facility] [place]

## 2023-02-15 ENCOUNTER — Encounter: Payer: Self-pay | Admitting: Family Medicine

## 2023-02-16 ENCOUNTER — Encounter: Payer: Self-pay | Admitting: Family Medicine

## 2023-02-28 ENCOUNTER — Encounter: Payer: Self-pay | Admitting: Neurology

## 2023-03-01 ENCOUNTER — Other Ambulatory Visit: Payer: Self-pay | Admitting: Neurology

## 2023-03-01 DIAGNOSIS — R42 Dizziness and giddiness: Secondary | ICD-10-CM

## 2023-03-01 DIAGNOSIS — G35 Multiple sclerosis: Secondary | ICD-10-CM

## 2023-03-03 ENCOUNTER — Encounter: Payer: Self-pay | Admitting: Neurology

## 2023-03-12 ENCOUNTER — Encounter: Payer: Self-pay | Admitting: Family Medicine

## 2023-03-17 ENCOUNTER — Other Ambulatory Visit (HOSPITAL_COMMUNITY): Payer: Self-pay

## 2023-04-05 DIAGNOSIS — Z08 Encounter for follow-up examination after completed treatment for malignant neoplasm: Secondary | ICD-10-CM | POA: Diagnosis not present

## 2023-04-05 DIAGNOSIS — L821 Other seborrheic keratosis: Secondary | ICD-10-CM | POA: Diagnosis not present

## 2023-04-05 DIAGNOSIS — Z129 Encounter for screening for malignant neoplasm, site unspecified: Secondary | ICD-10-CM | POA: Diagnosis not present

## 2023-04-05 DIAGNOSIS — Z85828 Personal history of other malignant neoplasm of skin: Secondary | ICD-10-CM | POA: Diagnosis not present

## 2023-04-05 DIAGNOSIS — L738 Other specified follicular disorders: Secondary | ICD-10-CM | POA: Diagnosis not present

## 2023-04-05 DIAGNOSIS — L720 Epidermal cyst: Secondary | ICD-10-CM | POA: Diagnosis not present

## 2023-04-05 DIAGNOSIS — L570 Actinic keratosis: Secondary | ICD-10-CM | POA: Diagnosis not present

## 2023-04-12 ENCOUNTER — Encounter: Payer: Self-pay | Admitting: Family Medicine

## 2023-04-12 DIAGNOSIS — J302 Other seasonal allergic rhinitis: Secondary | ICD-10-CM

## 2023-04-13 MED ORDER — AZELASTINE HCL 0.1 % NA SOLN: 1.0000 | Freq: Two times a day (BID) | NASAL | 0 refills | Status: DC

## 2023-04-13 NOTE — Telephone Encounter (Signed)
 No further action needed.

## 2023-05-05 ENCOUNTER — Other Ambulatory Visit: Payer: Self-pay

## 2023-05-05 DIAGNOSIS — J302 Other seasonal allergic rhinitis: Secondary | ICD-10-CM

## 2023-06-12 ENCOUNTER — Other Ambulatory Visit: Payer: Self-pay

## 2023-06-12 ENCOUNTER — Other Ambulatory Visit (HOSPITAL_COMMUNITY): Payer: Self-pay

## 2023-06-13 ENCOUNTER — Ambulatory Visit: Payer: Commercial Managed Care - PPO | Admitting: Neurology

## 2023-07-06 ENCOUNTER — Encounter: Payer: Self-pay | Admitting: Family Medicine

## 2023-07-06 ENCOUNTER — Ambulatory Visit (INDEPENDENT_AMBULATORY_CARE_PROVIDER_SITE_OTHER): Admitting: Family Medicine

## 2023-07-06 VITALS — BP 112/68 | HR 89 | Temp 98.1°F | Ht 65.0 in | Wt 159.2 lb

## 2023-07-06 DIAGNOSIS — Z13220 Encounter for screening for lipoid disorders: Secondary | ICD-10-CM

## 2023-07-06 DIAGNOSIS — Z0000 Encounter for general adult medical examination without abnormal findings: Secondary | ICD-10-CM

## 2023-07-06 DIAGNOSIS — Z793 Long term (current) use of hormonal contraceptives: Secondary | ICD-10-CM | POA: Diagnosis not present

## 2023-07-06 DIAGNOSIS — Z131 Encounter for screening for diabetes mellitus: Secondary | ICD-10-CM

## 2023-07-06 DIAGNOSIS — R5383 Other fatigue: Secondary | ICD-10-CM

## 2023-07-06 DIAGNOSIS — G35 Multiple sclerosis: Secondary | ICD-10-CM

## 2023-07-06 DIAGNOSIS — Z1211 Encounter for screening for malignant neoplasm of colon: Secondary | ICD-10-CM | POA: Diagnosis not present

## 2023-07-06 DIAGNOSIS — E611 Iron deficiency: Secondary | ICD-10-CM

## 2023-07-06 DIAGNOSIS — Z1231 Encounter for screening mammogram for malignant neoplasm of breast: Secondary | ICD-10-CM

## 2023-07-06 DIAGNOSIS — E781 Pure hyperglyceridemia: Secondary | ICD-10-CM | POA: Diagnosis not present

## 2023-07-06 DIAGNOSIS — Z23 Encounter for immunization: Secondary | ICD-10-CM

## 2023-07-06 NOTE — Patient Instructions (Addendum)

## 2023-07-06 NOTE — Addendum Note (Signed)
 Addended by: Margaretta Shaw A on: 07/06/2023 08:03 AM   Modules accepted: Orders

## 2023-07-06 NOTE — Progress Notes (Signed)
 Patient ID: Taylor Clements, female  DOB: 1970/12/17, 53 y.o.   MRN: [id number] Patient Care Team    Relationship Specialty Notifications Start End  Mariel Shope, DO PCP - General Family Medicine  11/21/17   Constantino Demark, FNP Nurse Practitioner Gynecology  10/22/21   Jorie Newness, MD  Neurology  07/06/23   Brandt Cake, DPM Consulting Physician Podiatry  07/06/23   Elois Hair, MD Consulting Physician Gastroenterology  07/06/23   Hollar, Earla Glassman, MD Referring Physician Dermatology  07/06/23     Chief Complaint  Patient presents with   Annual Exam    Pt is not fasting.     Subjective: Taylor Clements is a 53 y.o.  Female  present for CPE All past medical history, surgical history, allergies, family history, immunizations, medications and social history were updated in the electronic medical record today. All recent labs, ED visits and hospitalizations within the last year were reviewed.  Health maintenance:  Colonoscopy: No fhx; COMPLETED 09/2020- 10 yr Mammogram: completed: FHX mother, completed 04/2022, abnl left> rpt normal @ [facility]> order placed Cervical cancer screening: last pap: [facility]23/2021[facility]- FNP [facility]- 5 yr Immunizations: tdap due-declined, Influenza  (encouraged yearly). Shingrix declined Infectious disease screening: HIV completed, Hep C completed  DEXA: routine screen, no chronic steroid with MS. Routine screen 55-60 Assistive device: none Oxygen [id number] Patient has a Dental home. Hospitalizations/ED visits: reviewed      07/06/2023    7:41 AM 02/10/2023    8:03 AM 06/27/2022    1:04 PM 10/22/2021    3:43 PM 04/28/2020    7:59 AM  Depression screen PHQ 2/9  Decreased Interest 0 0 0 0 0  Down, Depressed, Hopeless 1 0 0 0 0  PHQ - 2 Score 1 0 0 0 0  Altered sleeping 0      Tired, decreased energy 0      Change in appetite 1      Feeling bad or failure about yourself  0      Trouble concentrating 0      Moving  slowly or fidgety/restless 0      Suicidal thoughts 0      PHQ-9 Score 2      Difficult doing work/chores Not difficult at all          07/06/2023    7:42 AM 12/27/2017    9:12 AM  GAD 7 : Generalized Anxiety Score  Nervous, Anxious, on Edge 0 0  Control/stop worrying 0 0  Worry too much - different things 0 0  Trouble relaxing 0 0  Restless 0 0  Easily annoyed or irritable 0 1  Afraid - awful might happen 0 0  Total GAD 7 Score 0 1  Anxiety Difficulty Not difficult at all Not difficult at all   Immunization History  Administered Date(s) Administered   Fluzone Influenza virus vaccine,trivalent (IIV3), split virus 12/01/2019, 11/21/2020   Influenza, Seasonal, Injecte, Preservative Fre 11/16/2022   Influenza,inj,Quad PF,6+ Mos 12/27/2017   Influenza-Unspecified 10/25/2018, 11/07/2020   PFIZER(Purple Top)SARS-COV-2 Vaccination 04/13/2019, 05/04/2019   Tdap 06/24/2013   Past Medical History:  Diagnosis Date   Actinic keratitis 12/07/2016   shaved, deep margins- pt declined further biospy, aldara and  Excel V laser completed by derm   Allergy    Anemia    Chicken pox    Chronic superficial gastritis without bleeding 06/23/2021   Idiopathic chronic venous hypertension of both legs with inflammation  Menorrhagia    control on BCP   Multiple sclerosis (HCC) 2005   diagnosed 2005; after optic Neuritis. Has IBS, fatiue and right arm paresthesia w/ flares   Numbness 03/25/2019   Optic neuritis due to multiple sclerosis (HCC) 2007   loss vision; IV steroids --> vision returned.    Phobia, flying    uses ativan  with flights   Post partum depression    Venous insufficiency (chronic) (peripheral)    Allergies  Allergen Reactions   Astelin  [Azelastine ] Rash   Past Surgical History:  Procedure Laterality Date   DILATION AND EVACUATION  2003   nonviable pregnancy   image: duplex LLE Left 05/16/2016   scanned into media.    Image: MRI Brain  07/10/2017   Stable minor  subcortical & periventricular white matter changes; MS   LASER ABLATION Left 2014   left greater saphenous vein, then endoevous chemical ablation and sclerotherpay.    MOHS SURGERY  12/2020   procedure: Excel V laser  12/07/2016   Ak tip of nose shaved-deep margins-pt declined deeper bx, aldara cream and laser completed by derm.    Family History  Problem Relation Age of Onset   Breast cancer Mother    Asthma Brother    Depression Brother    Drug abuse Brother    Learning disabilities Brother    Stroke Maternal Grandmother    Colon cancer Neg Hx    Esophageal cancer Neg Hx    Pancreatic cancer Neg Hx    Stomach cancer Neg Hx    Rectal cancer Neg Hx    Social History   Social History Narrative   Marital status/children/pets: married, 1 child   Safety:      -smoke alarm in the home:Yes     - wears seatbelt: Yes     - Feels safe in their relationships: Yes      Right handed    Caffeine use: tea daily    Allergies as of 07/06/2023       Reactions   Astelin  [azelastine ] Rash        Medication List        Accurate as of July 06, 2023  7:59 AM. If you have any questions, ask your nurse or doctor.          STOP taking these medications    azelastine  0.1 % nasal spray Commonly known as: ASTELIN  Stopped by: Kavonte Bearse   Calcium 500-125 MG-UNIT Tabs Stopped by: Napolean Backbone   Ferrex 150 150 MG capsule Generic drug: iron  polysaccharides Stopped by: Napolean Backbone   hyoscyamine  0.125 MG SL tablet Commonly known as: LEVSIN SL Stopped by: Napolean Backbone   LORazepam  1 MG tablet Commonly known as: ATIVAN  Stopped by: Napolean Backbone   meclizine  25 MG tablet Commonly known as: ANTIVERT  Stopped by: Napolean Backbone   Vitamin C 500 MG Caps Stopped by: Napolean Backbone       TAKE these medications    Fish Oil 1200 MG Caps Take 1 capsule by mouth 2 (two) times daily.   Jasmiel  3-0.02 MG tablet Generic drug: drospirenone -ethinyl estradiol  Take 1 tablet by mouth  daily.   loratadine 10 MG tablet Commonly known as: CLARITIN Take 10 mg by mouth daily.   multivitamin capsule Take 1 capsule by mouth daily.   Olopatadine HCl 0.2 % Soln Apply 1 drop to eye daily.   tretinoin  0.025 % cream Commonly known as: RETIN-A  Apply 1 Application topically at bedtime.  All past medical history, surgical history, allergies, family history, immunizations andmedications were updated in the EMR today and reviewed under the history and medication portions of their EMR.     No results found for this or any previous visit (from the past 2160 hours).  ROS 14 pt review of systems performed and negative (unless mentioned in an HPI)  Objective: BP 112/68   Pulse 89   Temp 98.1 F (36.7 C)   Ht 5' 5 (1.651 m)   Wt 159 lb 3.2 oz (72.2 kg)   SpO2 98%   BMI 26.49 kg/m  Physical Exam Vitals and nursing note reviewed.  Constitutional:      General: She is not in acute distress.    Appearance: Normal appearance. She is not ill-appearing or toxic-appearing.  HENT:     Head: Normocephalic and atraumatic.     Right Ear: Tympanic membrane, ear canal and external ear normal. There is no impacted cerumen.     Left Ear: Tympanic membrane, ear canal and external ear normal. There is no impacted cerumen.     Nose: No congestion or rhinorrhea.     Mouth/Throat:     Mouth: Mucous membranes are moist.     Pharynx: Oropharynx is clear. No oropharyngeal exudate or posterior oropharyngeal erythema.   Eyes:     General: No scleral icterus.       Right eye: No discharge.        Left eye: No discharge.     Extraocular Movements: Extraocular movements intact.     Conjunctiva/sclera: Conjunctivae normal.     Pupils: Pupils are equal, round, and reactive to light.    Cardiovascular:     Rate and Rhythm: Normal rate and regular rhythm.     Pulses: Normal pulses.     Heart sounds: Normal heart sounds. No murmur heard.    No friction rub. No gallop.  Pulmonary:      Effort: Pulmonary effort is normal. No respiratory distress.     Breath sounds: Normal breath sounds. No stridor. No wheezing, rhonchi or rales.  Chest:     Chest wall: No tenderness.  Abdominal:     General: Abdomen is flat. Bowel sounds are normal. There is no distension.     Palpations: Abdomen is soft. There is no mass.     Tenderness: There is no abdominal tenderness. There is no right CVA tenderness, left CVA tenderness, guarding or rebound.     Hernia: No hernia is present.   Musculoskeletal:        General: No swelling, tenderness or deformity. Normal range of motion.     Cervical back: Normal range of motion and neck supple. No rigidity or tenderness.     Right lower leg: No edema.     Left lower leg: No edema.  Lymphadenopathy:     Cervical: No cervical adenopathy.   Skin:    General: Skin is warm and dry.     Coloration: Skin is not jaundiced or pale.     Findings: No bruising, erythema, lesion or rash.   Neurological:     General: No focal deficit present.     Mental Status: She is alert and oriented to person, place, and time. Mental status is at baseline.     Cranial Nerves: No cranial nerve deficit.     Sensory: No sensory deficit.     Motor: No weakness.     Coordination: Coordination normal.     Gait: Gait normal.  Deep Tendon Reflexes: Reflexes normal.   Psychiatric:        Mood and Affect: Mood normal.        Behavior: Behavior normal.        Thought Content: Thought content normal.        Judgment: Judgment normal.      No results found.  Assessment/plan: Selinda Korzeniewski is a 53 y.o. female present for CPE  Multiple sclerosis (HCC) Managed by neuro Hypertriglyceridemia Lipid collected - Hemoglobin A1c Routine general medical examination at a health care facility Patient was encouraged to exercise greater than 150 minutes a week. Patient was encouraged to choose a diet filled with fresh fruits and vegetables, and lean meats. AVS  provided to patient today for education/recommendation on gender specific health and safety maintenance. Colonoscopy: No fhx; COMPLETED 09/2020- 10 yr Mammogram: completed: FHX mother, completed 04/2022, abnl left> rpt normal @ [facility]> order placed Cervical cancer screening: last [facility]: [facility]- FNP [facility]- 5 yr Immunizations: tdap due- decline, Influenza  (encouraged yearly). Shingrix declined Infectious disease screening: HIV completed, Hep C completed  DEXA: routine screen, no chronic steroid with MS. Routine screen 55-60 - CBC - Comprehensive metabolic panel with GFR - TSH  Breast cancer screening by mammogram - MM 3D SCREENING MAMMOGRAM BILATERAL BREAST; Future Lipid screening - Lipid panel Diabetes mellitus screening - Hemoglobin A1c Multiple sclerosis (HCC) - Vitamin D  (25 hydroxy) Hypertriglyceridemia - Vitamin D  (25 hydroxy) Iron  deficiency - Iron , TIBC and Ferritin Panel Long term current use of hormonal contraceptive - Hemoglobin A1c - Lipid panel - Vitamin D  (25 hydroxy) fatigue - Vitamin D  (25 hydroxy)   Return in about 1 year (around 07/06/2024) for cpe (20 min).  Orders Placed This Encounter  Procedures   MM 3D SCREENING MAMMOGRAM BILATERAL BREAST   CBC   Comprehensive metabolic panel with GFR   Hemoglobin A1c   Lipid panel   TSH   Iron , TIBC and Ferritin Panel   Vitamin D  (25 hydroxy)   No orders of the defined types were placed in this encounter.  Referral Orders  No referral(s) requested today     Electronically signed by: Napolean Backbone, DO [facility]

## 2023-07-07 ENCOUNTER — Ambulatory Visit: Payer: Self-pay | Admitting: Family Medicine

## 2023-07-07 LAB — CBC
HCT: 42.3 % (ref 35.0–45.0)
Hemoglobin: 13.9 g/dL (ref 11.7–15.5)
MCH: 31.9 pg (ref 27.0–33.0)
MCHC: 32.9 g/dL (ref 32.0–36.0)
MCV: 97 fL (ref 80.0–100.0)
MPV: 8.4 fL (ref 7.5–12.5)
Platelets: 237 10*3/uL (ref 140–400)
RBC: 4.36 10*6/uL (ref 3.80–5.10)
RDW: 12 % (ref 11.0–15.0)
WBC: 4.9 10*3/uL (ref 3.8–10.8)

## 2023-07-07 LAB — COMPREHENSIVE METABOLIC PANEL WITH GFR
AG Ratio: 1.4 (calc) (ref 1.0–2.5)
ALT: 16 U/L (ref 6–29)
AST: 15 U/L (ref 10–35)
Albumin: 4.2 g/dL (ref 3.6–5.1)
Alkaline phosphatase (APISO): 44 U/L (ref 37–153)
BUN: 15 mg/dL (ref 7–25)
CO2: 26 mmol/L (ref 20–32)
Calcium: 9.3 mg/dL (ref 8.6–10.4)
Chloride: 105 mmol/L (ref 98–110)
Creat: 0.74 mg/dL (ref 0.50–1.03)
Globulin: 2.9 g/dL (ref 1.9–3.7)
Glucose, Bld: 75 mg/dL (ref 65–99)
Potassium: 4 mmol/L (ref 3.5–5.3)
Sodium: 139 mmol/L (ref 135–146)
Total Bilirubin: 0.3 mg/dL (ref 0.2–1.2)
Total Protein: 7.1 g/dL (ref 6.1–8.1)
eGFR: 97 mL/min/{1.73_m2} (ref >=60)

## 2023-07-07 LAB — IRON,TIBC AND FERRITIN PANEL
%SAT: 26 % (ref 16–45)
Ferritin: 85 ng/mL (ref 16–232)
Iron: 101 ug/dL (ref 45–160)
TIBC: 388 ug/dL (ref 250–450)

## 2023-07-07 LAB — VITAMIN D 25 HYDROXY (VIT D DEFICIENCY, FRACTURES): Vit D, 25-Hydroxy: 71 ng/mL (ref 30–100)

## 2023-07-07 LAB — HEMOGLOBIN A1C
Hgb A1c MFr Bld: 5.3 % (ref <5.7)
Mean Plasma Glucose: 105 mg/dL
eAG (mmol/L): 5.8 mmol/L

## 2023-07-07 LAB — LIPID PANEL
Cholesterol: 184 mg/dL (ref <200)
HDL: 83 mg/dL (ref >=50)
LDL Cholesterol (Calc): 73 mg/dL
Non-HDL Cholesterol (Calc): 101 mg/dL (ref <130)
Total CHOL/HDL Ratio: 2.2 (calc) (ref <5.0)
Triglycerides: 190 mg/dL — ABNORMAL HIGH (ref <150)

## 2023-07-07 LAB — TSH: TSH: 1.6 m[IU]/L

## 2023-07-08 ENCOUNTER — Encounter: Payer: Self-pay | Admitting: Family Medicine

## 2023-08-16 ENCOUNTER — Ambulatory Visit: Admitting: Neurology

## 2023-08-16 ENCOUNTER — Encounter: Payer: Self-pay | Admitting: Neurology

## 2023-08-16 VITALS — BP 99/68 | HR 78 | Ht 64.5 in | Wt 160.4 lb

## 2023-08-16 DIAGNOSIS — R400 Somnolence: Secondary | ICD-10-CM | POA: Diagnosis not present

## 2023-08-16 DIAGNOSIS — R42 Dizziness and giddiness: Secondary | ICD-10-CM | POA: Diagnosis not present

## 2023-08-16 DIAGNOSIS — H9319 Tinnitus, unspecified ear: Secondary | ICD-10-CM | POA: Diagnosis not present

## 2023-08-16 DIAGNOSIS — G35 Multiple sclerosis: Secondary | ICD-10-CM

## 2023-08-16 NOTE — Progress Notes (Signed)
 [facility]  PATIENT: Taylor Clements DOB: 20-Apr-1970  REFERRING DOCTOR OR PCP:  Charlies Bellini   _________________________________   HISTORICAL  CHIEF COMPLAINT:  She is a 53 y.o. woman with relapsing remitting multiple sclerosis diagnosed in 2005  Update 06/13/2022: She feels her MS is stable.   She did her second year of Mavenclad soon May 15-19 and June 12-16, 2022.  (She had her first year of Mavenclad in May 18-22 and Jul 08, 2019).   She generally tolerated the weeks of therapy well with mild fatigue and headaches a few days after each cycle.  She is completely at baseline now.  She has noted no new symptoms  Before she had the second year of therapy, she had an MRI of the brain 05/23/2020.  It showed no new lesions compared to 04/17/2019.  Gait, balance, strength and sensation.  She is able to walk > 3 miles without stopping.   She sometimes feels she needs to hold the bannister going downstairs but not upstairs.  She denies weakness or spasticity Bladder function is unchanged with occasional urgency.   Vision is fine (needs readers and gets some eye strain though).     Notes fatigue some afternoons/evenings.   Mood is good.   She denies cognitive issues.   She sleeps well most nights.    Last year she had a ferritin of 3 ng/mL (83-767) but now 85.  Vit D is good .  She is now taking Fe nd Vit D supplements.     She had normal colonoscopy and UGD.  She also had an endometrial biopsy which was fine.   She is on Lysteda during periods to reduce flow.          She had a basal cell carcinoma in December 2022.  She had a clear Moh's margin on the second cut [facility].  Dr. Dionisio).  She has not had any further lesions. She does yearly exam.   She has had tinnitus since her second Covid-19 vaccinations.   She saw ENT shortly afterwards but no abnormal findings noted.  I did recheck the images from her last MRI.  There were no enhancing  lesions involving the vestibulocochlear nerve.   EPWORTH SLEEPINESS SCALE  On a scale of 0 - 3 what is the chance of dozing:  Sitting and Reading:   1 Watching TV:    1 Sitting inactive in a public place: 0 Passenger in car for one hour: 3 Lying down to rest in the afternoon: 3 Sitting and talking to someone: 0 Sitting quietly after lunch:  1 In a car, stopped in traffic:  0  Total (out of 24):    9/24 not EDS     MS History:  She was diagnosed with MS in late 2005.  She first presented with right optic neuritis that year.   She was treated with several days of IV Solu-Medrol  and vision improved.   An MRI was consistent with MS.   She was initially placed on Avonex late 2005 or early 2006.  Around 2014, after Plegridy  was approved, she switched from Avonex to Plegridy .   She has no definite exacerbation.  Around 2013, she noted spasticity in her right arm and more fatigue.     She was placed on baclofen but only took it about a month or less.     She has not been on any treatment for her fatigue.   She had her  1st year Mavenclad May 18/Jul 08 2019.  She had the second year Mavenclad May/June 2022.    Imaging studies: MRI of the brain performed in 2018.  It shows 6 or 7 small T2/flair hyperintense foci, 2 in the periventricular white matter and the rest in the subcortical and deep white matter.  None of the foci appear to be acute.  There was no atrophy.  There are no lesions in the infratentorial white matter.  None of the foci enhance.  MRI 04/17/19 showed scattered T2/FLAIR hyperintense foci in the periventricular, juxtacortical and deep white matter of the hemispheres in a pattern and configuration compatible with chronic demyelinating plaque associated with multiple sclerosis.  None the foci appear to be acute.  They do not enhance.  Compared to the MRI dated 07/10/2017, there are no new lesions.  MRI 05/23/2020 shwoed T2/flair hyperintense foci in the hemispheres.  None of the foci  enhance or appear to be acute.  They are consistent with chronic demyelinating plaque associated with multiple sclerosis.  Compared to the MRI from 04/17/2019, there are no new lesions.  She has no FH of MS or other autoimmune disorder.        REVIEW OF SYSTEMS: Constitutional: No fevers, chills, sweats, or change in appetite.  She has fatigue.    Sleeps well Eyes: No visual changes, double vision, eye pain Ear, nose and throat: No hearing loss, ear pain, nasal congestion, sore throat Cardiovascular: No chest pain, palpitations Respiratory:  No shortness of breath at rest or with exertion.   No wheezes GastrointestinaI: No nausea, vomiting, abdominal pain, fecal incontinence.  Occ diarrhea, maybe IBS.  Genitourinary:  No dysuria,.  No nocturia.  Some urgency Musculoskeletal:  No neck pain, back pain Integumentary: No rash, pruritus, skin lesions Neurological: as above Psychiatric: No depression at this time.  No anxiety Endocrine: No palpitations, diaphoresis, change in appetite, change in weigh or increased thirst Hematologic/Lymphatic:  No anemia, purpura, petechiae. Allergic/Immunologic: No itchy/runny eyes, nasal congestion, recent allergic reactions, rashes  ALLERGIES: Allergies  Allergen Reactions   Astelin  [Azelastine ] Rash    HOME MEDICATIONS:  Current Outpatient Medications:    drospirenone -ethinyl estradiol  (JASMIEL ) 3-0.02 MG tablet, Take 1 tablet by mouth daily., Disp: 84 tablet, Rfl: 4   levocetirizine (XYZAL) 5 MG tablet, Take 5 mg by mouth every evening., Disp: , Rfl:    Multiple Vitamin (MULTIVITAMIN) capsule, Take 1 capsule by mouth daily., Disp: , Rfl:    Olopatadine HCl 0.2 % SOLN, Apply 1 drop to eye daily., Disp: , Rfl:    Omega-3 Fatty Acids (FISH OIL) 1200 MG CAPS, Take 1 capsule by mouth 2 (two) times daily., Disp: , Rfl:    tretinoin  (RETIN-A ) 0.025 % cream, Apply 1 Application topically at bedtime., Disp: 45 g, Rfl: 6  PAST MEDICAL HISTORY: Past Medical  History:  Diagnosis Date   Actinic keratitis 12/07/2016   shaved, deep margins- pt declined further biospy, aldara and  Excel V laser completed by derm   Allergy    Anemia    Chicken pox    Chronic superficial gastritis without bleeding 06/23/2021   Idiopathic chronic venous hypertension of both legs with inflammation    Menorrhagia    control on BCP   Multiple sclerosis (HCC) 2005   diagnosed 2005; after optic Neuritis. Has IBS, fatiue and right arm paresthesia w/ flares   Numbness 03/25/2019   Optic neuritis due to multiple sclerosis (HCC) 2007   loss vision; IV steroids --> vision returned.  Phobia, flying    uses ativan  with flights   Post partum depression    Venous insufficiency (chronic) (peripheral)     PAST SURGICAL HISTORY: Past Surgical History:  Procedure Laterality Date   DILATION AND EVACUATION  2003   nonviable pregnancy   image: duplex LLE Left 05/16/2016   scanned into media.    Image: MRI Brain  07/10/2017   Stable minor subcortical & periventricular white matter changes; MS   LASER ABLATION Left 2014   left greater saphenous vein, then endoevous chemical ablation and sclerotherpay.    MOHS SURGERY  12/2020   procedure: Excel V laser  12/07/2016   Ak tip of nose shaved-deep margins-pt declined deeper bx, aldara cream and laser completed by derm.     FAMILY HISTORY: Family History  Problem Relation Age of Onset   Breast cancer Mother    Asthma Brother    Depression Brother    Drug abuse Brother    Learning disabilities Brother    Stroke Maternal Grandmother    Colon cancer Neg Hx    Esophageal cancer Neg Hx    Pancreatic cancer Neg Hx    Stomach cancer Neg Hx    Rectal cancer Neg Hx     SOCIAL HISTORY:  Social History   Socioeconomic History   Marital status: Married    Spouse name: Jerel   Number of children: 1   Years of education: BA   Highest education level: Bachelor's degree (e.g., BA, AB, BS)  Occupational History    Occupation: n/a  Tobacco Use   Smoking status: Never   Smokeless tobacco: Never  Vaping Use   Vaping status: Never Used  Substance and Sexual Activity   Alcohol use: Not Currently    Comment: rare   Drug use: Never   Sexual activity: Yes    Partners: Male    Birth control/protection: Condom  Other Topics Concern   Not on file  Social History Narrative   Marital status/children/pets: married, 1 child   Safety:      -smoke alarm in the home:Yes     - wears seatbelt: Yes     - Feels safe in their relationships: Yes      Right handed    Caffeine use: tea daily   Social Drivers of Corporate investment banker Strain: Low Risk  (02/09/2023)   Overall Financial Resource Strain (CARDIA)    Difficulty of Paying Living Expenses: Not very hard  Food Insecurity: No Food Insecurity (02/09/2023)   Hunger Vital Sign    Worried About Running Out of Food in the Last Year: Never true    Ran Out of Food in the Last Year: Never true  Transportation Needs: No Transportation Needs (02/09/2023)   PRAPARE - Administrator, Civil Service (Medical): No    Lack of Transportation (Non-Medical): No  Physical Activity: Insufficiently Active (02/09/2023)   Exercise Vital Sign    Days of Exercise per Week: 5 days    Minutes of Exercise per Session: 20 min  Stress: No Stress Concern Present (02/09/2023)   [employer] of Occupational Health - Occupational Stress Questionnaire    Feeling of Stress : Only a little  Social Connections: Unknown (02/09/2023)   Social Connection and Isolation Panel    Frequency of Communication with Friends and Family: Twice a week    Frequency of Social Gatherings with Friends and Family: Patient declined    Attends Religious Services: Never    Active Member  of Clubs or Organizations: No    Attends Banker Meetings: Not on file    Marital Status: Married  Intimate Partner Violence: Not on file     PHYSICAL EXAM (from 03/27/2018 initial  consult)  Vitals:   08/16/23 0820  BP: 99/68  Pulse: 78  Weight: 160 lb 6.4 oz (72.8 kg)  Height: 5' 4.5 (1.638 m)    Body mass index is 27.11 kg/m.  VA:   OD 20/25    OS 20/20  General: The patient is well-developed and well-nourished and in no acute distress  Skin: Extremities are without significant edema.  Neurologic Exam  Mental status: The patient is alert and oriented x 3 at the time of the examination. The patient has apparent normal recent and remote memory, with an apparently normal attention span and concentration ability.   Speech is normal.  Cranial nerves: Extraocular movements are full..Facial strength is normal.  Trapezius and sternocleidomastoid strength is normal. No dysarthria is noted.  Hearing seems normal and symmetric  motor:  Muscle bulk is normal.   Tone is normal. Strength is  5 / 5 in all 4 extremities.   Sensory: Sensory testing is intact to pinprick, soft touch and vibration sensation in all 4 extremities.  Coordination: Cerebellar testing reveals good finger-nose-finger and heel-to-shin bilaterally.  Gait and station: Station is normal.  Gait and tandem gait are normal...   Romberg is negative.   Reflexes: Deep tendon reflexes are symmetric and normal bilaterally.      Multiple sclerosis (HCC) - Plan: MR BRAIN W WO CONTRAST  Vertigo - Plan: MR BRAIN W WO CONTRAST  Tinnitus, unspecified laterality  Sleepiness   1.    Her MS appears to be stable with no exacerbations since starting Mavenclad.  Hopefully, this will continue for many years.  We will check the MRI of the brain to determine if there is any subclinical progression.  If this is occurring, we will need to consider restarting a disease modifying therapy.  2.   Stay active and exercise as tolerated. 3.   If sleepiness worsens, consider adding modafinil or other agent.   She will return to see me in 12 months,      Jasma Seevers A. Vear, MD, [facility] 08/16/2023, 9:10 AM Certified in  Neurology, Clinical Neurophysiology, Sleep Medicine, Pain Medicine and Neuroimaging  [facility] [address], [address] [place], [place] [address] [phone]

## 2023-08-26 ENCOUNTER — Encounter: Payer: Self-pay | Admitting: Family Medicine

## 2023-08-28 NOTE — Telephone Encounter (Signed)
 Please schedule patient an appointment as discussed.  Can be virtual

## 2023-09-04 ENCOUNTER — Other Ambulatory Visit (HOSPITAL_COMMUNITY): Payer: Self-pay

## 2023-09-06 NOTE — Telephone Encounter (Signed)
 No further action needed.

## 2023-09-14 ENCOUNTER — Telehealth: Admitting: Family Medicine

## 2023-09-14 ENCOUNTER — Other Ambulatory Visit: Payer: Self-pay

## 2023-09-14 ENCOUNTER — Encounter: Payer: Self-pay | Admitting: Family Medicine

## 2023-09-14 DIAGNOSIS — T753XXA Motion sickness, initial encounter: Secondary | ICD-10-CM | POA: Diagnosis not present

## 2023-09-14 DIAGNOSIS — F40243 Fear of flying: Secondary | ICD-10-CM

## 2023-09-14 MED ORDER — DIAZEPAM 5 MG PO TABS
2.5000 mg | ORAL_TABLET | Freq: Two times a day (BID) | ORAL | 0 refills | Status: AC | PRN
  Filled 2023-09-14: qty 30, 15d supply, fill #0

## 2023-09-14 MED ORDER — ONDANSETRON 4 MG PO TBDP
4.0000 mg | ORAL_TABLET | Freq: Three times a day (TID) | ORAL | 0 refills | Status: AC | PRN
  Filled 2023-09-14: qty 20, 7d supply, fill #0

## 2023-09-14 MED ORDER — SCOPOLAMINE 1 MG/3DAYS TD PT72
1.0000 | MEDICATED_PATCH | TRANSDERMAL | 0 refills | Status: AC
  Filled 2023-09-14: qty 10, 30d supply, fill #0

## 2023-09-14 NOTE — Patient Instructions (Signed)
 Environmental modification   ?Looking at the horizon or a distant, stationary object.  ?Avoidance of reading or looking at a screen while in a moving environment, as this can increase conflict between vestibular and visual cues.  ?Selecting seats where motion is the least. In a boat, lower deck and midship cabins are recommended. In a car, the front seat is recommended. In a plane, a seat over the front edge of the wing is recommended. If travelling by train or bus, forward facing seats are recommended.  ?Driving the car is better than being the passenger, presumably because visual information is consistent with vestibular motion detection. When a passenger, it is preferable to be in the front seat with one's eyes on the road, as if driving the car.  Data on the efficacy of environmental modifications are limited, but studies have suggested benefit from looking at a stable visual reference point, avoidance of reading while in motion, and driving rather than being a passenger [[id number]]. In addition, seating locations with more motion are associated with higher risk for motion sickness   advise patients suck on hard ginger candies if they experience or anticipate experiencing symptoms of motion sickness.   Acupressure bands are typically applied to both wrists prophylactically but can also be worn after symptoms have begun.   Acupressure for motion sickness applies pressure at the P6 acupressure point on the anterior wrist (three fingerbreadths proximal to the proximal wrist fold, between the palmaris longus and flexor carpi radialis tendons) either by manual pressure or with a wrist band.

## 2023-09-14 NOTE — Progress Notes (Signed)
 VIRTUAL VISIT VIA VIDEO  I connected with Taylor Clements on 09/14/23 at  7:40 AM EDT by a video enabled telemedicine application and verified that I am speaking with the correct person using two identifiers. Location patient: Home Location provider: [facility], Office Persons participating in the virtual visit: Patient, Dr. Catherine and ALONSO Sharps, CMA  I discussed the limitations of evaluation and management by telemedicine and the availability of in person appointments. The patient expressed understanding and agreed to proceed.     Taylor Clements , [birth year], 53 y.o., female MRN: [id number] Patient Care Team    Relationship Specialty Notifications Start End  Catherine Charlies LABOR, DO PCP - General Family Medicine  11/21/17   Kennard Lorane PARAS, FNP Nurse Practitioner Gynecology  10/22/21   Vear Charlie LABOR, MD  Neurology  07/06/23   Magdalen Pasco RAMAN, DPM Consulting Physician Podiatry  07/06/23   Stacia Glendia BRAVO, MD Consulting Physician Gastroenterology  07/06/23   Hollar, Carlin Bullard, MD Referring Physician Dermatology  07/06/23     Chief Complaint  Patient presents with   Travel Counseling    Pt is requesting med for motion sickness before traveling.      Subjective: Taylor Clements is a 53 y.o. 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   Last we prescribed her scopolamine  patches for motion sickness prevention for a cruise.  Her husband was medically evacuated about 56 hours in, therefore she is uncertain how well it worked for her.  She reported she felt "off" for about 48 hours and was starting to feel normal, but she had a horrible taste in my mouth all of the time which made food and water taste bad.  That started within 24 hours of arriving on the ship. They are attempting another cruise next month and she is wondering if she should try the patches again, but is leery about changing case she experienced.  She is wondering if there  is anything else she can do for seasickness other than the patches. She also has a slight that she is concerned about your pain and what she can take to prevent? She has an 8-day cruise planned She has used Ativan  in the past to help with the flying phobia.  This has worked well for her in the past. She used Dramamine in the past for her seasickness, that did not work well for her.    07/06/2023    7:41 AM 02/10/2023    8:03 AM 06/27/2022    1:04 PM 10/22/2021    3:43 PM 04/28/2020    7:59 AM  Depression screen PHQ 2/9  Decreased Interest 0 0 0 0 0  Down, Depressed, Hopeless 1 0 0 0 0  PHQ - 2 Score 1 0 0 0 0  Altered sleeping 0      Tired, decreased energy 0      Change in appetite 1      Feeling bad or failure about yourself  0      Trouble concentrating 0      Moving slowly or fidgety/restless 0      Suicidal thoughts 0      PHQ-9 Score 2      Difficult doing work/chores Not difficult at all        Allergies  Allergen Reactions   Astelin  [Azelastine ] Rash   Social History   Social History Narrative   Marital status/children/pets: married, 1 child  Safety:      -smoke alarm in the home:Yes     - wears seatbelt: Yes     - Feels safe in their relationships: Yes      Right handed    Caffeine use: tea daily   Past Medical History:  Diagnosis Date   Actinic keratitis 12/07/2016   shaved, deep margins- pt declined further biospy, aldara and  Excel V laser completed by derm   Allergy    Anemia    Chicken pox    Chronic superficial gastritis without bleeding 06/23/2021   Idiopathic chronic venous hypertension of both legs with inflammation    Menorrhagia    control on BCP   Multiple sclerosis (HCC) 2005   diagnosed 2005; after optic Neuritis. Has IBS, fatiue and right arm paresthesia w/ flares   Numbness 03/25/2019   Optic neuritis due to multiple sclerosis (HCC) 2007   loss vision; IV steroids --> vision returned.    Phobia, flying    uses ativan  with flights   Post  partum depression    Venous insufficiency (chronic) (peripheral)    Past Surgical History:  Procedure Laterality Date   DILATION AND EVACUATION  2003   nonviable pregnancy   image: duplex LLE Left 05/16/2016   scanned into media.    Image: MRI Brain  07/10/2017   Stable minor subcortical & periventricular white matter changes; MS   LASER ABLATION Left 2014   left greater saphenous vein, then endoevous chemical ablation and sclerotherpay.    MOHS SURGERY  12/2020   procedure: Excel V laser  12/07/2016   Ak tip of nose shaved-deep margins-pt declined deeper bx, aldara cream and laser completed by derm.    Family History  Problem Relation Age of Onset   Breast cancer Mother    Asthma Brother    Depression Brother    Drug abuse Brother    Learning disabilities Brother    Stroke Maternal Grandmother    Colon cancer Neg Hx    Esophageal cancer Neg Hx    Pancreatic cancer Neg Hx    Stomach cancer Neg Hx    Rectal cancer Neg Hx    Allergies as of 09/14/2023       Reactions   Astelin  [azelastine ] Rash        Medication List        Accurate as of September 14, 2023  9:18 AM. If you have any questions, ask your nurse or doctor.          diazepam  5 MG tablet Commonly known as: VALIUM  Take 0.5-1 tablets (2.5-5 mg total) by mouth every 12 (twelve) hours as needed for anxiety. Started by: Charlies Bellini   Fish Oil 1200 MG Caps Take 1 capsule by mouth 2 (two) times daily.   Taylor Clements  3-0.02 MG tablet Generic drug: drospirenone -ethinyl estradiol  Take 1 tablet by mouth daily.   levocetirizine 5 MG tablet Commonly known as: XYZAL Take 5 mg by mouth every evening.   multivitamin capsule Take 1 capsule by mouth daily.   Olopatadine HCl 0.2 % Soln Apply 1 drop to eye daily.   ondansetron  4 MG disintegrating tablet Commonly known as: ZOFRAN -ODT Take 1 tablet (4 mg total) by mouth every 8 (eight) hours as needed for nausea or vomiting. Started by: Kathern Lobosco    scopolamine  1 MG/3DAYS Commonly known as: Transderm-Scop Place 1 patch (1.5 mg total) onto the skin every 3 (three) days. Started by: Irasema Chalk   tretinoin  0.025 % cream Commonly known  as: RETIN-A  Apply 1 Application topically at bedtime.        All past medical history, surgical history, allergies, family history, immunizations andmedications were updated in the EMR today and reviewed under the history and medication portions of their EMR.     ROS Negative, with the exception of above mentioned in HPI   Objective:  There were no vitals taken for this visit. There is no height or weight on file to calculate BMI. Physical Exam Vitals and nursing note reviewed.  Constitutional:      General: She is not in acute distress.    Appearance: Normal appearance. She is normal weight. She is not ill-appearing or toxic-appearing.  HENT:     Head: Normocephalic and atraumatic.  Eyes:     General: No scleral icterus.       Right eye: No discharge.        Left eye: No discharge.     Extraocular Movements: Extraocular movements intact.     Conjunctiva/sclera: Conjunctivae normal.     Pupils: Pupils are equal, round, and reactive to light.  Pulmonary:     Effort: Pulmonary effort is normal.  Musculoskeletal:     Cervical back: Normal range of motion.  Skin:    Findings: No rash.  Neurological:     Mental Status: She is alert and oriented to person, place, and time. Mental status is at baseline.     Motor: No weakness.     Coordination: Coordination normal.     Gait: Gait normal.  Psychiatric:        Mood and Affect: Mood normal.        Behavior: Behavior normal.        Thought Content: Thought content normal.        Judgment: Judgment normal.      No results found. No results found. No results found for this or any previous visit (from the past 24 hours).  Assessment/Plan: Yurika Pereda is a 53 y.o. female present for OV for  Seasickness, initial  encounter Discussed different options.  Dramamine has been used in the past and was not helpful. Discussed scopolamine  patches and appropriate use.  Discussed environmental modifications that can be helpful and alternative treatments such as ginger and use of sea bands Since patient had a potential side effect of altered taste perception with scopolamine  last time, we decided to use Valium  as a backup at low-dose if needed. Patient understands she is to place this patch on the area behind her ear, at least 4 hours before boarding ship or the day prior in order report to work properly.  Patch is to be changed every 72 hours.  Flying phobia Valium  2.5-5 mg twice daily as needed before flight.  Decided to try Valium  in place of Ativan  so that she could also have on hand if needing another option for seasickness treatment [place]  controlled substance [place] reviewed today and appropriate today  Reviewed expectations re: course of current medical issues. Discussed self-management of symptoms. Outlined signs and symptoms indicating need for more acute intervention. Patient verbalized understanding and all questions were answered. Patient received an After-Visit Summary.    No orders of the defined types were placed in this encounter.  Meds ordered this encounter  Medications   scopolamine  (TRANSDERM-SCOP) 1 MG/3DAYS    Sig: Place 1 patch (1.5 mg total) onto the skin every 3 (three) days.    Dispense:  10 patch    Refill:  0  ondansetron  (ZOFRAN -ODT) 4 MG disintegrating tablet    Sig: Take 1 tablet (4 mg total) by mouth every 8 (eight) hours as needed for nausea or vomiting.    Dispense:  20 tablet    Refill:  0   diazepam  (VALIUM ) 5 MG tablet    Sig: Take 0.5-1 tablets (2.5-5 mg total) by mouth every 12 (twelve) hours as needed for anxiety.    Dispense:  30 tablet    Refill:  0   Referral Orders  No referral(s) requested today     Note is dictated utilizing voice  recognition software. Although note has been proof read prior to signing, occasional typographical errors still can be missed. If any questions arise, please do not hesitate to call for verification.   electronically signed by:  Charlies Bellini, DO  [facility] - [place]

## 2023-09-15 ENCOUNTER — Ambulatory Visit
Admission: RE | Admit: 2023-09-15 | Discharge: 2023-09-15 | Disposition: A | Discharge status: Home / Self Care | Source: Ambulatory Visit | Attending: Family Medicine | Admitting: Family Medicine

## 2023-09-15 DIAGNOSIS — Z1231 Encounter for screening mammogram for malignant neoplasm of breast: Secondary | ICD-10-CM

## 2023-09-19 ENCOUNTER — Telehealth: Payer: Self-pay | Admitting: Neurology

## 2023-09-19 NOTE — Telephone Encounter (Signed)
 I talked to her on 7/24 about scheduling her MRI and she said she would either call me back to schedule at [facility] or she would call GI to schedule but she has not been scheduled anywhere yet.

## 2023-10-03 ENCOUNTER — Ambulatory Visit: Payer: Commercial Managed Care - PPO | Admitting: Neurology

## 2023-11-11 ENCOUNTER — Other Ambulatory Visit (HOSPITAL_BASED_OUTPATIENT_CLINIC_OR_DEPARTMENT_OTHER): Payer: Self-pay

## 2023-11-11 MED ORDER — FLUZONE 0.5 ML IM SUSY
0.5000 mL | PREFILLED_SYRINGE | Freq: Once | INTRAMUSCULAR | 0 refills | Status: AC
  Filled 2023-11-11: qty 0.5, 1d supply, fill #0

## 2023-11-14 ENCOUNTER — Other Ambulatory Visit (HOSPITAL_COMMUNITY): Payer: Self-pay

## 2023-11-14 ENCOUNTER — Other Ambulatory Visit: Payer: Self-pay

## 2023-11-14 DIAGNOSIS — N921 Excessive and frequent menstruation with irregular cycle: Secondary | ICD-10-CM | POA: Diagnosis not present

## 2023-11-14 DIAGNOSIS — Z01419 Encounter for gynecological examination (general) (routine) without abnormal findings: Secondary | ICD-10-CM | POA: Diagnosis not present

## 2023-11-14 DIAGNOSIS — N814 Uterovaginal prolapse, unspecified: Secondary | ICD-10-CM | POA: Diagnosis not present

## 2023-11-14 MED ORDER — DROSPIRENONE-ETHINYL ESTRADIOL 3-0.02 MG PO TABS
1.0000 | ORAL_TABLET | Freq: Every day | ORAL | 4 refills | Status: AC
  Filled 2023-11-14: qty 84, 84d supply, fill #0
  Filled 2024-02-04: qty 84, 84d supply, fill #1

## 2023-12-02 ENCOUNTER — Ambulatory Visit (HOSPITAL_BASED_OUTPATIENT_CLINIC_OR_DEPARTMENT_OTHER)

## 2023-12-09 ENCOUNTER — Ambulatory Visit (HOSPITAL_BASED_OUTPATIENT_CLINIC_OR_DEPARTMENT_OTHER): Admission: RE | Admit: 2023-12-09 | Source: Ambulatory Visit

## 2023-12-26 DIAGNOSIS — L820 Inflamed seborrheic keratosis: Secondary | ICD-10-CM | POA: Diagnosis not present

## 2023-12-26 DIAGNOSIS — L814 Other melanin hyperpigmentation: Secondary | ICD-10-CM | POA: Diagnosis not present

## 2023-12-26 DIAGNOSIS — L738 Other specified follicular disorders: Secondary | ICD-10-CM | POA: Diagnosis not present

## 2024-01-08 DIAGNOSIS — Z8669 Personal history of other diseases of the nervous system and sense organs: Secondary | ICD-10-CM | POA: Diagnosis not present

## 2024-01-08 DIAGNOSIS — G35D Multiple sclerosis, unspecified: Secondary | ICD-10-CM | POA: Diagnosis not present

## 2024-01-13 ENCOUNTER — Ambulatory Visit: Payer: Self-pay | Admitting: Neurology

## 2024-01-13 ENCOUNTER — Inpatient Hospital Stay: Admission: RE | Admit: 2024-01-13 | Discharge: 2024-01-13 | Attending: Neurology | Admitting: Neurology

## 2024-01-13 DIAGNOSIS — G35D Multiple sclerosis, unspecified: Secondary | ICD-10-CM | POA: Diagnosis not present

## 2024-01-13 DIAGNOSIS — R42 Dizziness and giddiness: Secondary | ICD-10-CM | POA: Diagnosis not present

## 2024-01-13 MED ORDER — GADOPICLENOL 0.5 MMOL/ML IV SOLN
9.0000 mL | Freq: Once | INTRAVENOUS | Status: AC | PRN
  Administered 2024-01-13: 9 mL via INTRAVENOUS

## 2024-01-31 ENCOUNTER — Encounter: Payer: Self-pay | Admitting: Family Medicine

## 2024-01-31 NOTE — Telephone Encounter (Signed)
"  No further action needed at this time.  "

## 2024-02-05 ENCOUNTER — Other Ambulatory Visit (HOSPITAL_COMMUNITY): Payer: Self-pay

## 2024-07-08 ENCOUNTER — Encounter: Admitting: Family Medicine

## 2024-08-21 ENCOUNTER — Ambulatory Visit: Admitting: Neurology
